# Patient Record
Sex: Female | Born: 1955 | Race: White | Hispanic: No | Marital: Married | Smoking: Never smoker
Health system: Southern US, Community
[De-identification: ages and names within clinical notes are randomized; demographics above are authoritative.]

## PROBLEM LIST (undated history)

## (undated) DIAGNOSIS — K222 Esophageal obstruction: Secondary | ICD-10-CM

## (undated) DIAGNOSIS — M26629 Arthralgia of temporomandibular joint, unspecified side: Secondary | ICD-10-CM

## (undated) DIAGNOSIS — E785 Hyperlipidemia, unspecified: Secondary | ICD-10-CM

## (undated) DIAGNOSIS — N62 Hypertrophy of breast: Secondary | ICD-10-CM

## (undated) DIAGNOSIS — E039 Hypothyroidism, unspecified: Secondary | ICD-10-CM

## (undated) DIAGNOSIS — R42 Dizziness and giddiness: Secondary | ICD-10-CM

## (undated) DIAGNOSIS — K219 Gastro-esophageal reflux disease without esophagitis: Secondary | ICD-10-CM

## (undated) DIAGNOSIS — K449 Diaphragmatic hernia without obstruction or gangrene: Secondary | ICD-10-CM

## (undated) DIAGNOSIS — N6310 Unspecified lump in the right breast, unspecified quadrant: Secondary | ICD-10-CM

## (undated) DIAGNOSIS — F419 Anxiety disorder, unspecified: Secondary | ICD-10-CM

## (undated) HISTORY — PX: CATARACT EXTRACTION, BILATERAL: SHX1313

## (undated) HISTORY — DX: Esophageal obstruction: K22.2

## (undated) HISTORY — PX: COLONOSCOPY: SHX5424

## (undated) HISTORY — PX: BREAST BIOPSY: SHX20

## (undated) HISTORY — DX: Hyperlipidemia, unspecified: E78.5

## (undated) HISTORY — DX: Diaphragmatic hernia without obstruction or gangrene: K44.9

---

## 2011-09-07 DIAGNOSIS — D229 Melanocytic nevi, unspecified: Secondary | ICD-10-CM | POA: Insufficient documentation

## 2011-09-07 DIAGNOSIS — L709 Acne, unspecified: Secondary | ICD-10-CM | POA: Insufficient documentation

## 2014-01-11 DIAGNOSIS — N62 Hypertrophy of breast: Secondary | ICD-10-CM

## 2014-01-11 HISTORY — DX: Hypertrophy of breast: N62

## 2014-01-12 ENCOUNTER — Encounter (HOSPITAL_BASED_OUTPATIENT_CLINIC_OR_DEPARTMENT_OTHER): Payer: Self-pay | Admitting: *Deleted

## 2014-01-16 ENCOUNTER — Ambulatory Visit (HOSPITAL_BASED_OUTPATIENT_CLINIC_OR_DEPARTMENT_OTHER)
Admission: RE | Admit: 2014-01-16 | Discharge: 2014-01-16 | Disposition: A | Discharge status: Home / Self Care | Payer: BC Managed Care – PPO | Source: Ambulatory Visit | Attending: Plastic Surgery | Admitting: Plastic Surgery

## 2014-01-16 ENCOUNTER — Encounter (HOSPITAL_BASED_OUTPATIENT_CLINIC_OR_DEPARTMENT_OTHER)
Admission: RE | Disposition: A | Discharge status: Home / Self Care | Payer: Self-pay | Source: Ambulatory Visit | Attending: Plastic Surgery

## 2014-01-16 ENCOUNTER — Ambulatory Visit (HOSPITAL_BASED_OUTPATIENT_CLINIC_OR_DEPARTMENT_OTHER): Payer: BC Managed Care – PPO | Admitting: Certified Registered"

## 2014-01-16 ENCOUNTER — Encounter (HOSPITAL_BASED_OUTPATIENT_CLINIC_OR_DEPARTMENT_OTHER): Payer: Self-pay | Admitting: *Deleted

## 2014-01-16 ENCOUNTER — Encounter (HOSPITAL_BASED_OUTPATIENT_CLINIC_OR_DEPARTMENT_OTHER): Payer: BC Managed Care – PPO | Admitting: Certified Registered"

## 2014-01-16 DIAGNOSIS — E039 Hypothyroidism, unspecified: Secondary | ICD-10-CM | POA: Insufficient documentation

## 2014-01-16 DIAGNOSIS — N62 Hypertrophy of breast: Secondary | ICD-10-CM | POA: Diagnosis present

## 2014-01-16 DIAGNOSIS — F419 Anxiety disorder, unspecified: Secondary | ICD-10-CM | POA: Diagnosis not present

## 2014-01-16 DIAGNOSIS — K219 Gastro-esophageal reflux disease without esophagitis: Secondary | ICD-10-CM | POA: Diagnosis not present

## 2014-01-16 HISTORY — DX: Hypothyroidism, unspecified: E03.9

## 2014-01-16 HISTORY — PX: BREAST REDUCTION SURGERY: SHX8

## 2014-01-16 HISTORY — DX: Anxiety disorder, unspecified: F41.9

## 2014-01-16 HISTORY — DX: Arthralgia of temporomandibular joint, unspecified side: M26.629

## 2014-01-16 HISTORY — DX: Gastro-esophageal reflux disease without esophagitis: K21.9

## 2014-01-16 HISTORY — DX: Hypertrophy of breast: N62

## 2014-01-16 LAB — POCT HEMOGLOBIN-HEMACUE: HEMOGLOBIN: 12.2 g/dL (ref 12.0–15.0)

## 2014-01-16 SURGERY — MAMMOPLASTY, REDUCTION
Anesthesia: General | Site: Breast | Laterality: Bilateral

## 2014-01-16 MED ORDER — BSS IO SOLN: 15.0000 mL | Freq: Once | INTRAOCULAR | Status: DC

## 2014-01-16 MED ORDER — LIDOCAINE HCL (CARDIAC) 20 MG/ML IV SOLN
INTRAVENOUS | Status: DC | PRN
  Administered 2014-01-16: 60 mg via INTRAVENOUS

## 2014-01-16 MED ORDER — BACITRACIN ZINC 500 UNIT/GM EX OINT
TOPICAL_OINTMENT | CUTANEOUS | Status: DC | PRN
  Administered 2014-01-16: 1 via TOPICAL

## 2014-01-16 MED ORDER — FENTANYL CITRATE 0.05 MG/ML IJ SOLN
INTRAMUSCULAR | Status: DC | PRN
  Administered 2014-01-16 (×2): 100 ug via INTRAVENOUS
  Administered 2014-01-16: 75 ug via INTRAVENOUS
  Administered 2014-01-16: 50 ug via INTRAVENOUS
  Administered 2014-01-16: 25 ug via INTRAVENOUS

## 2014-01-16 MED ORDER — OXYCODONE HCL 5 MG PO TABS
5.0000 mg | ORAL_TABLET | Freq: Once | ORAL | Status: AC | PRN
  Administered 2014-01-16: 5 mg via ORAL

## 2014-01-16 MED ORDER — GLYCOPYRROLATE 0.2 MG/ML IJ SOLN
INTRAMUSCULAR | Status: DC | PRN
  Administered 2014-01-16: 0.2 mg via INTRAVENOUS

## 2014-01-16 MED ORDER — MEPERIDINE HCL 25 MG/ML IJ SOLN: 6.2500 mg | INTRAMUSCULAR | Status: DC | PRN

## 2014-01-16 MED ORDER — MIDAZOLAM HCL 2 MG/2ML IJ SOLN: 1.0000 mg | INTRAMUSCULAR | Status: DC | PRN

## 2014-01-16 MED ORDER — OXYCODONE HCL 5 MG/5ML PO SOLN: 5.0000 mg | Freq: Once | ORAL | Status: AC | PRN

## 2014-01-16 MED ORDER — LACTATED RINGERS IV SOLN
INTRAVENOUS | Status: DC
  Administered 2014-01-16: 08:00:00 via INTRAVENOUS
  Administered 2014-01-16: 10 mL/h via INTRAVENOUS
  Administered 2014-01-16 (×2): via INTRAVENOUS

## 2014-01-16 MED ORDER — CEFAZOLIN SODIUM 1-5 GM-% IV SOLN
1.0000 g | Freq: Once | INTRAVENOUS | Status: AC
  Administered 2014-01-16: 2 g via INTRAVENOUS

## 2014-01-16 MED ORDER — ONDANSETRON HCL 4 MG/2ML IJ SOLN: 4.0000 mg | Freq: Once | INTRAMUSCULAR | Status: DC | PRN

## 2014-01-16 MED ORDER — PROPOFOL 10 MG/ML IV BOLUS
INTRAVENOUS | Status: AC
  Filled 2014-01-16: qty 20

## 2014-01-16 MED ORDER — CEFAZOLIN SODIUM-DEXTROSE 2-3 GM-% IV SOLR
INTRAVENOUS | Status: AC
  Filled 2014-01-16: qty 50

## 2014-01-16 MED ORDER — OXYCODONE HCL 5 MG PO TABS
ORAL_TABLET | ORAL | Status: AC
Start: 2014-01-16 — End: 2014-01-16
  Filled 2014-01-16: qty 1

## 2014-01-16 MED ORDER — BUPIVACAINE LIPOSOME 1.3 % IJ SUSP
INTRAMUSCULAR | Status: DC | PRN
  Administered 2014-01-16: 20 mL

## 2014-01-16 MED ORDER — HYDROMORPHONE HCL 1 MG/ML IJ SOLN
INTRAMUSCULAR | Status: AC
  Filled 2014-01-16: qty 1

## 2014-01-16 MED ORDER — PROPOFOL 10 MG/ML IV EMUL
INTRAVENOUS | Status: AC
  Filled 2014-01-16: qty 50

## 2014-01-16 MED ORDER — MIDAZOLAM HCL 2 MG/ML PO SYRP: 12.0000 mg | ORAL_SOLUTION | Freq: Once | ORAL | Status: DC | PRN

## 2014-01-16 MED ORDER — DEXAMETHASONE SODIUM PHOSPHATE 4 MG/ML IJ SOLN
INTRAMUSCULAR | Status: DC | PRN
  Administered 2014-01-16: 10 mg via INTRAVENOUS

## 2014-01-16 MED ORDER — LIDOCAINE-EPINEPHRINE 1 %-1:100000 IJ SOLN
INTRAMUSCULAR | Status: DC | PRN
  Administered 2014-01-16: 40 mL

## 2014-01-16 MED ORDER — KETOROLAC TROMETHAMINE 0.5 % OP SOLN
1.0000 [drp] | Freq: Three times a day (TID) | OPHTHALMIC | Status: DC | PRN
  Administered 2014-01-16: 1 [drp] via OPHTHALMIC
  Filled 2014-01-16: qty 3

## 2014-01-16 MED ORDER — HYDROMORPHONE HCL 1 MG/ML IJ SOLN
0.2500 mg | INTRAMUSCULAR | Status: DC | PRN
  Administered 2014-01-16 (×3): 0.5 mg via INTRAVENOUS

## 2014-01-16 MED ORDER — SUCCINYLCHOLINE CHLORIDE 20 MG/ML IJ SOLN
INTRAMUSCULAR | Status: DC | PRN
  Administered 2014-01-16: 100 mg via INTRAVENOUS

## 2014-01-16 MED ORDER — BUPIVACAINE LIPOSOME 1.3 % IJ SUSP
INTRAMUSCULAR | Status: AC
  Filled 2014-01-16: qty 20

## 2014-01-16 MED ORDER — FENTANYL CITRATE 0.05 MG/ML IJ SOLN: 50.0000 ug | INTRAMUSCULAR | Status: DC | PRN

## 2014-01-16 MED ORDER — SUCCINYLCHOLINE CHLORIDE 20 MG/ML IJ SOLN
INTRAMUSCULAR | Status: AC
  Filled 2014-01-16: qty 1

## 2014-01-16 MED ORDER — PROPOFOL 10 MG/ML IV BOLUS
INTRAVENOUS | Status: DC | PRN
  Administered 2014-01-16: 30 mg via INTRAVENOUS
  Administered 2014-01-16: 150 mg via INTRAVENOUS

## 2014-01-16 MED ORDER — LIDOCAINE-EPINEPHRINE 1 %-1:100000 IJ SOLN
INTRAMUSCULAR | Status: AC
  Filled 2014-01-16: qty 2

## 2014-01-16 MED ORDER — MIDAZOLAM HCL 5 MG/5ML IJ SOLN
INTRAMUSCULAR | Status: DC | PRN
  Administered 2014-01-16: 2 mg via INTRAVENOUS

## 2014-01-16 MED ORDER — MIDAZOLAM HCL 2 MG/2ML IJ SOLN
INTRAMUSCULAR | Status: AC
  Filled 2014-01-16: qty 2

## 2014-01-16 MED ORDER — ONDANSETRON HCL 4 MG/2ML IJ SOLN
INTRAMUSCULAR | Status: DC | PRN
  Administered 2014-01-16: 4 mg via INTRAVENOUS

## 2014-01-16 MED ORDER — FENTANYL CITRATE 0.05 MG/ML IJ SOLN
INTRAMUSCULAR | Status: AC
  Filled 2014-01-16: qty 12

## 2014-01-16 MED ORDER — BACITRACIN ZINC 500 UNIT/GM EX OINT
TOPICAL_OINTMENT | CUTANEOUS | Status: AC
  Filled 2014-01-16: qty 28.35

## 2014-01-16 MED ORDER — SCOPOLAMINE 1 MG/3DAYS TD PT72
MEDICATED_PATCH | TRANSDERMAL | Status: AC
  Filled 2014-01-16: qty 1

## 2014-01-16 SURGICAL SUPPLY — 62 items
BAG DECANTER FOR FLEXI CONT (MISCELLANEOUS) ×3 IMPLANT
BENZOIN TINCTURE PRP APPL 2/3 (GAUZE/BANDAGES/DRESSINGS) ×6 IMPLANT
BLADE KNIFE PERSONA 10 (BLADE) ×12 IMPLANT
BLADE KNIFE PERSONA 15 (BLADE) ×9 IMPLANT
BNDG GAUZE ELAST 4 BULKY (GAUZE/BANDAGES/DRESSINGS) ×6 IMPLANT
CANISTER SUCT 1200ML W/VALVE (MISCELLANEOUS) ×6 IMPLANT
CAP BOUFFANT 24 BLUE NURSES (PROTECTIVE WEAR) ×3 IMPLANT
CLOSURE WOUND 1/2 X4 (GAUZE/BANDAGES/DRESSINGS) ×4
COVER MAYO STAND STRL (DRAPES) ×3 IMPLANT
COVER TABLE BACK 60X90 (DRAPES) ×3 IMPLANT
DECANTER SPIKE VIAL GLASS SM (MISCELLANEOUS) IMPLANT
DRAIN CHANNEL 10F 3/8 F FF (DRAIN) ×6 IMPLANT
DRAPE LAPAROSCOPIC ABDOMINAL (DRAPES) ×3 IMPLANT
DRAPE U-SHAPE 76X120 STRL (DRAPES) IMPLANT
DRSG EMULSION OIL 3X3 NADH (GAUZE/BANDAGES/DRESSINGS) ×6 IMPLANT
DRSG PAD ABDOMINAL 8X10 ST (GAUZE/BANDAGES/DRESSINGS) ×6 IMPLANT
ELECT REM PT RETURN 9FT ADLT (ELECTROSURGICAL) ×3
ELECTRODE REM PT RTRN 9FT ADLT (ELECTROSURGICAL) ×1 IMPLANT
EVACUATOR SILICONE 100CC (DRAIN) ×6 IMPLANT
FILTER 7/8 IN (FILTER) IMPLANT
GAUZE SPONGE 4X4 12PLY STRL (GAUZE/BANDAGES/DRESSINGS) ×6 IMPLANT
GLOVE BIO SURGEON STRL SZ7 (GLOVE) ×3 IMPLANT
GLOVE BIOGEL M 7.0 STRL (GLOVE) ×3 IMPLANT
GLOVE BIOGEL PI IND STRL 7.0 (GLOVE) ×2 IMPLANT
GLOVE BIOGEL PI IND STRL 7.5 (GLOVE) ×1 IMPLANT
GLOVE BIOGEL PI INDICATOR 7.0 (GLOVE) ×4
GLOVE BIOGEL PI INDICATOR 7.5 (GLOVE) ×2
GLOVE ECLIPSE 6.5 STRL STRAW (GLOVE) ×12 IMPLANT
GOWN STRL REUS W/ TWL LRG LVL3 (GOWN DISPOSABLE) ×4 IMPLANT
GOWN STRL REUS W/TWL LRG LVL3 (GOWN DISPOSABLE) ×8
IV NS 250ML (IV SOLUTION) ×2
IV NS 250ML BAXH (IV SOLUTION) ×1 IMPLANT
NEEDLE HYPO 25X1 1.5 SAFETY (NEEDLE) ×9 IMPLANT
NEEDLE SPNL 18GX3.5 QUINCKE PK (NEEDLE) ×3 IMPLANT
NS IRRIG 1000ML POUR BTL (IV SOLUTION) ×9 IMPLANT
PACK BASIN DAY SURGERY FS (CUSTOM PROCEDURE TRAY) ×3 IMPLANT
PIN SAFETY STERILE (MISCELLANEOUS) ×3 IMPLANT
SCRUB PCMX 4 OZ (MISCELLANEOUS) ×3 IMPLANT
SLEEVE SCD COMPRESS KNEE MED (MISCELLANEOUS) ×3 IMPLANT
SPECIMEN JAR MEDIUM (MISCELLANEOUS) IMPLANT
SPECIMEN JAR X LARGE (MISCELLANEOUS) ×6 IMPLANT
SPONGE LAP 18X18 X RAY DECT (DISPOSABLE) ×9 IMPLANT
STAPLER VISISTAT 35W (STAPLE) ×9 IMPLANT
STRIP CLOSURE SKIN 1/2X4 (GAUZE/BANDAGES/DRESSINGS) ×8 IMPLANT
SUT ETHILON 3 0 PS 1 (SUTURE) ×3 IMPLANT
SUT MNCRL AB 3-0 PS2 18 (SUTURE) ×12 IMPLANT
SUT MNCRL AB 4-0 PS2 18 (SUTURE) ×6 IMPLANT
SUT MON AB 5-0 PS2 18 (SUTURE) ×6 IMPLANT
SUT PROLENE 2 0 CT2 30 (SUTURE) ×3 IMPLANT
SUT PROLENE 3 0 PS 1 (SUTURE) ×6 IMPLANT
SUT QUILL PDO 2-0 (SUTURE) ×6 IMPLANT
SYR BULB IRRIGATION 50ML (SYRINGE) ×6 IMPLANT
SYR CONTROL 10ML LL (SYRINGE) ×9 IMPLANT
TOWEL OR 17X24 6PK STRL BLUE (TOWEL DISPOSABLE) ×9 IMPLANT
TOWEL OR NON WOVEN STRL DISP B (DISPOSABLE) ×3 IMPLANT
TRAY DSU PREP LF (CUSTOM PROCEDURE TRAY) ×3 IMPLANT
TRAY FOLEY CATH 16FR SILVER (SET/KITS/TRAYS/PACK) ×3 IMPLANT
TUBE CONNECTING 20'X1/4 (TUBING) ×1
TUBE CONNECTING 20X1/4 (TUBING) ×2 IMPLANT
UNDERPAD 30X30 INCONTINENT (UNDERPADS AND DIAPERS) ×9 IMPLANT
VAC PENCILS W/TUBING CLEAR (MISCELLANEOUS) ×3 IMPLANT
YANKAUER SUCT BULB TIP NO VENT (SUCTIONS) ×3 IMPLANT

## 2014-01-16 NOTE — Brief Op Note (Signed)
01/16/2014  11:49 AM  PATIENT:  Jessica [name]  58 y.o. female  PRE-OPERATIVE DIAGNOSIS:  Bilateral Hypertrophy of breasts [N62]  POST-OPERATIVE DIAGNOSIS:  Bilateral Hypertrophy of breasts  PROCEDURE:  Procedure(s): BILATERAL MAMMARY REDUCTION  (BREAST) (Bilateral)  SURGEON:  Surgeon(s) and Role:    * Cristianna Cyr A Demon Volante, MD - Primary  ANESTHESIA:   general  EBL:  Total I/O In: 2500 [I.V.:2500] Out: 875 [Urine:700; Blood:175]  BLOOD ADMINISTERED:none  DRAINS: (65F) Jackson-Pratt drain(s) with closed bulb suction in the Bilateral breasts   LOCAL MEDICATIONS USED:  1.3% Exparel (total 266 mgs.)  SPECIMEN:  Source of Specimen:  Bilareal breasts  DISPOSITION OF SPECIMEN:  PATHOLOGY  COUNTS:  YES  DICTATION: .Other Dictation: Dictation Number [id number]  PLAN OF CARE: Discharge to home after PACU  PATIENT DISPOSITION:  PACU - hemodynamically stable.   Delay start of Pharmacological VTE agent (>24hrs) due to surgical blood loss or risk of bleeding: not applicable

## 2014-01-16 NOTE — Anesthesia Procedure Notes (Signed)
Procedure Name: Intubation Date/Time: 01/16/2014 7:50 AM Performed by: Dashauna Heymann Pre-anesthesia Checklist: Patient identified, Emergency Drugs available, Suction available and Patient being monitored Patient Re-evaluated:Patient Re-evaluated prior to inductionOxygen Delivery Method: Circle System Utilized Preoxygenation: Pre-oxygenation with 100% oxygen Intubation Type: IV induction Ventilation: Mask ventilation without difficulty Laryngoscope Size: Mac and 3 Grade View: Grade I Tube type: Oral Tube size: 7.0 mm Number of attempts: 1 Airway Equipment and Method: stylet and oral airway Placement Confirmation: ETT inserted through vocal cords under direct vision,  positive ETCO2 and breath sounds checked- equal and bilateral Tube secured with: Tape Dental Injury: Teeth and Oropharynx as per pre-operative assessment

## 2014-01-16 NOTE — H&P (Signed)
  H&P faxed to surgical center.  -History and Physical Reviewed  -Patient has been re-examined  -No change in the plan of care  Zonie Crutcher A    

## 2014-01-16 NOTE — Anesthesia Preprocedure Evaluation (Addendum)
Anesthesia Evaluation  Patient identified by MRN, date of birth, ID band Patient awake    Reviewed: Allergy & Precautions, H&P , NPO status , Patient's Chart, lab work & pertinent test results  Airway Mallampati: I TM Distance: >3 FB Neck ROM: Full    Dental   Pulmonary          Cardiovascular     Neuro/Psych PSYCHIATRIC DISORDERS Anxiety    GI/Hepatic GERD-  Medicated and Controlled,  Endo/Other  Hypothyroidism   Renal/GU      Musculoskeletal   Abdominal   Peds  Hematology   Anesthesia Other Findings   Reproductive/Obstetrics                          Anesthesia Physical Anesthesia Plan  ASA: I  Anesthesia Plan: General   Post-op Pain Management:    Induction: Intravenous  Airway Management Planned: Oral ETT  Additional Equipment:   Intra-op Plan:   Post-operative Plan: Extubation in OR  Informed Consent: I have reviewed the patients History and Physical, chart, labs and discussed the procedure including the risks, benefits and alternatives for the proposed anesthesia with the patient or authorized representative who has indicated his/her understanding and acceptance.     Plan Discussed with: CRNA and Surgeon  Anesthesia Plan Comments:         Anesthesia Quick Evaluation

## 2014-01-16 NOTE — Discharge Instructions (Signed)
JP Drain [employer] this sheet to all of your post-operative appointments while you have your drains.  Please measure your drains by CC's or ML's.  Make sure you drain and measure your JP Drains 3 times per day.  At the end of each day, add up totals for the left side and add up totals for the right side.    ( 9 am )     ( 3 pm )        ( 9 pm )                Date L  R  L  R  L  R  Total L/R                                                                                                                                                                                          Post Anesthesia Home Care Instructions  Activity: Get plenty of rest for the remainder of the day. A responsible adult should stay with you for 24 hours following the procedure.  For the next 24 hours, DO NOT: -Drive a car -Paediatric nurse -Drink alcoholic beverages -Take any medication unless instructed by your physician -Make any legal decisions or sign important papers.  Meals: Start with liquid foods such as gelatin or soup. Progress to regular foods as tolerated. Avoid greasy, spicy, heavy foods. If nausea and/or vomiting occur, drink only clear liquids until the nausea and/or vomiting subsides. Call your physician if vomiting continues.  Special Instructions/Symptoms: Your throat may feel dry or sore from the anesthesia or the breathing tube placed in your throat during surgery. If this causes discomfort, gargle with warm salt water. The discomfort should disappear within 24 hours.   Information for Discharge Teaching: EXPAREL (bupivacaine liposome injectable suspension)   Your surgeon gave you EXPAREL(bupivacaine) in your surgical incision to help control your pain after surgery.   EXPAREL is a local anesthetic that provides pain relief by numbing the tissue around the surgical site.  EXPAREL is designed to release pain medication over time and can control pain for up to 72  hours.  Depending on how you respond to EXPAREL, you may require less pain medication during your recovery.  Possible side effects:  Temporary loss of sensation or ability to move in the area where bupivacaine was injected.  Nausea, vomiting, constipation  Rarely, numbness and tingling in your mouth or lips, lightheadedness, or anxiety may occur.  Call your doctor right away if you think you may be experiencing any of these sensations, or if you have other questions regarding possible side effects.  Follow all other discharge instructions given to you by your surgeon or nurse. Eat a healthy diet and drink plenty of water or other fluids.  If you return to the hospital for any reason within 96 hours following the administration of EXPAREL, please inform your health care providers.

## 2014-01-16 NOTE — Progress Notes (Signed)
Called to evaluate pt with C/O "scatchy" feeling in Left eye.  Pt reports pain in left eye, some blurring of vision.  Exam revealed PERRLA, sclera injected on R side.  No obvious abrasion noted.  Eye irrigated with BSS with some improvement.  Acular drops given with incomplete relief.  Pt asked for eye to be patched, and this was done. A/P: Probable corneal abrasion.  Pt instructed to use Acular drops.  If not better in 24 hours will need to seek opthalmologic evaluation.  This was discussed with patient and family agreed with the plan. We will contact her tomorrow to assess her progress.  They understand that corneal abrasions can take 24-48 to heal.

## 2014-01-16 NOTE — Anesthesia Postprocedure Evaluation (Signed)
Anesthesia Post Note  Patient: Jessica [name]  Procedure(s) Performed: Procedure(s) (LRB): BILATERAL MAMMARY REDUCTION  (BREAST) (Bilateral)  Anesthesia type: general  Patient location: PACU  Post pain: Pain level controlled  Post assessment: Patient's Cardiovascular Status Stable  Last Vitals:  Filed Vitals:   01/16/14 1330  BP: 105/55  Pulse: 70  Temp:   Resp: 18    Post vital signs: Reviewed and stable  Level of consciousness: sedated  Complications: probable corneal abrasion

## 2014-01-16 NOTE — Transfer of Care (Signed)
Immediate Anesthesia Transfer of Care Note  Patient: Jessica [name]  Procedure(s) Performed: Procedure(s): BILATERAL MAMMARY REDUCTION  (BREAST) (Bilateral)  Patient Location: PACU  Anesthesia Type:General  Level of Consciousness: awake, alert , oriented and patient cooperative  Airway & Oxygen Therapy: Patient Spontanous Breathing and Patient connected to face mask oxygen  Post-op Assessment: Report given to PACU RN and Post -op Vital signs reviewed and stable  Post vital signs: Reviewed and stable  Complications: No apparent anesthesia complications

## 2014-01-17 ENCOUNTER — Encounter (HOSPITAL_BASED_OUTPATIENT_CLINIC_OR_DEPARTMENT_OTHER): Payer: Self-pay | Admitting: Plastic Surgery

## 2014-02-23 NOTE — Op Note (Signed)
NAMESAFAA, STINGLEY NO.:  [id number]  MEDICAL RECORD NO.:  [id number]  LOCATION:                                 FACILITY:  PHYSICIAN:  Hetty Blend, M.D.DATE OF BIRTH:  30-May-1966  DATE OF PROCEDURE:  01/16/2014 DATE OF DISCHARGE:                              OPERATIVE REPORT   PREOPERATIVE DIAGNOSIS:  Bilateral macromastia.  POSTOPERATIVE DIAGNOSIS:  Bilateral macromastia.  PROCEDURE:  Bilateral reduction mammoplasties.  ATTENDING SURGEON:  Hetty Blend, MD  ANESTHESIA:  General.  ANESTHESIOLOGIST:  Crissie Sickles. Conrad Lincoln, MD  COMPLICATIONS:  None.  INDICATIONS FOR THE PROCEDURE:  The patient is a 58 year old Caucasian female who has bilateral macromastia that is clinically symptomatic. She presents to undergo bilateral reduction mammoplasties.  DESCRIPTION OF PROCEDURE:  The patient was brought into the preop holding area and marked in a pattern of Wise for the future bilateral reduction mammoplasties.  She was then taken back to the OR, placed on the table in supine position.  After adequate general anesthesia was obtained, the patient's chest was prepped with Techni-Care and draped in sterile fashion.  The bases of the breasts had been injected with 1% lidocaine with epinephrine.  After adequate hemostasis and anesthesia had taken effect, the procedure was begun.  Both of the breast reductions were performed in the following similar manner.  The nipple-areolar complex was marked with a 45-mm nipple marker.  The skin was then incised and deepithelialized around the nipple-areolar complex down to the inframammary crease in inferior pedicle pattern.  Next, the medial, superior, and lateral skin flaps were elevated down to the chest wall.  The excess fat and glandular tissue removed from the inferior pedicle.  The nipple-areolar complex was examined and found to be pink and viable.  The wound was irrigated with saline irrigation.  Meticulous  hemostasis was obtained with the Bovie electrocautery.  The inferior pedicle was centralized using 3-0 Prolene suture.  A #10 JP flat fully fluted drain was placed into the wound.  The skin flaps were brought together at the inverted T junction with a 2-0 Prolene suture.  The incisions were stapled for temporary closure.  The breasts compared and found to have good shape and symmetry.  The incisions were then closed from the medial aspect of the [facility] incision to the medial aspect of the JP drain first placing a few 3- 0 Monocryl sutures to tack together the dermal layer, and then both the dermal and cuticular layer were closed in a single layer using a 2-0 Quill PDO barbed suture.  Lateral to the JP drain incision was closed using 3-0 Monocryl in the dermal layer, followed by 3-0 Monocryl running intracuticular stitch on the skin.  The vertical limb of the Wise pattern was closed in the dermal layer using 3-0 Monocryl suture.  The patient was placed in the upright position.  The future location in the nipple-areolar complex was marked on both breast mounds using the 45- mm nipple marker.  She was then placed back in the recumbent position.  Both of the nipple-areolar complexes were brought out onto the breast mounds in the following similar manner.  The skin was incised and marked and removed full thickness  into the subcutaneous tissues.  The nipple- areolar complex was examined, found to be pink and viable, then brought out through this aperture and sewn in place using 4-0 Monocryl in the dermal layer, followed by a 5-0 Monocryl running intracuticular stitch on the skin.  The 5-0 Monocryl suture was then brought down the cuticular layer of the vertical limb as well.  The JP drain was sewn in place using 3-0 nylon suture.  Prior to closing the incision, the breast and chest soft tissues along with pectoralis major muscle and fascia were infiltrated with 1.3% Exparel and now the [facility]  incision was infiltrated as well for a total of 266 mg to provide postoperative pain control with the patient.  The incision was dressed with benzoin, Steri- Strips, and the nipples were additionally dressed with bacitracin ointment and Adaptic.  4x4s placed over the incisions and ABD pads in the axillary areas.  The patient was placed into light postoperative support bra.  There were no complications.  The patient tolerated the procedure well.  The final needle, sponge counts were reported to be correct at the end of the case.  The patient was also extubated and taken to the PACU in stable condition.  She was recovered in the PACU without complications.  Both the patient and her family were given proper postoperative wound care instructions including care of the JP drains.  The patient was then discharged home in the care of her family in stable condition.  Followup appointment will be within a few days in the office.          ______________________________ Hetty Blend, M.D.     MC/MEDQ  D:  02/22/2014  T:  02/23/2014  Job:  [id number]

## 2014-03-22 ENCOUNTER — Other Ambulatory Visit: Payer: Self-pay | Admitting: Podiatry

## 2014-03-30 NOTE — Patient Instructions (Signed)
Jessica [name]  03/30/2014   Your procedure is scheduled on:  04/11/2014  Report to [facility] at  33  AM.  Call this number if you have problems the morning of surgery: [phone]   Remember:   Do not eat food or drink liquids after midnight.   Take these medicines the morning of surgery with A SIP OF WATER:  Xanax, abilify, nexium, levothyroxine, zolft   Do not wear jewelry, make-up or nail polish.  Do not wear lotions, powders, or perfumes.   Do not shave 48 hours prior to surgery. Men may shave face and neck.  Do not bring valuables to the hospital.  [facility] is not responsible for any belongings or valuables.               Contacts, dentures or bridgework may not be worn into surgery.  Leave suitcase in the car. After surgery it may be brought to your room.  For patients admitted to the hospital, discharge time is determined by your treatment team.               Patients discharged the day of surgery will not be allowed to drive home.  Name and phone number of your driver: family  Special Instructions: Shower using CHG 2 nights before surgery and the night before surgery.  If you shower the day of surgery use CHG.  Use special wash - you have one bottle of CHG for all showers.  You should use approximately 1/3 of the bottle for each shower.   Please read over the following fact sheets that you were given: Pain Booklet, Coughing and Deep Breathing, Surgical Site Infection Prevention, Anesthesia Post-op Instructions and Care and Recovery After Surgery Bunionectomy A bunionectomy is surgery to remove a bunion. A bunion is an enlargement of the joint at the base of the big toe. It is made up of bone and soft tissue on the inside part of the joint. Over time, a painful lump appears on the inside of the joint. The big toe begins to point inward toward the second toe. New bone growth can occur and a bone spur may form. The pain eventually causes difficulty walking. A bunion  usually results from inflammation caused by the irritation of poorly fitting shoes. It often begins later in life. A bunionectomy is performed when nonsurgical treatment no longer works. When surgery is needed, the extent of the procedure will depend on the degree of deformity of the foot. Your surgeon will discuss with you the different procedures and what will work best for you depending on your age and health. LET YOUR CAREGIVER KNOW ABOUT:   Previous problems with anesthetics or medicines used to numb the skin.  Allergies to dyes, iodine, foods, and/or latex.  Medicines taken including herbs, eye drops, prescription medicines (especially medicines used to "thin the blood"), aspirin and other over-the-counter medicines, and steroids (by mouth or as a cream).  History of bleeding or blood problems.  Possibility of pregnancy, if this applies.  History of blood clots in your legs and/or lungs .  Previous surgery.  Other important health problems. RISKS AND COMPLICATIONS   Infection.  Pain.  Nerve damage.  Possibility that the bunion will recur. BEFORE THE PROCEDURE  You should be present 60 minutes prior to your procedure or as directed.  PROCEDURE  Surgery is often done so that you can go home the same day (outpatient). It may be done in a hospital or  in an outpatient surgical center. An anesthetic will be used to help you sleep during the procedure. Sometimes, a spinal anesthetic is used to make you numb below the waist. A cut (incision) is made over the swollen area at the first joint of the big toe. The enlarged lump will be removed. If there is a need to reposition the bones of the big toe, this may require more than 1 incision. The bone itself may need to be cut. Screws and wires may be used in the repair. These can be removed at a later date. In severe cases, the entire joint may need to be removed and a joint replacement inserted. When done, the incision is closed with stitches  (sutures). Skin adhesive strips may be added for reinforcement. They help hold the incision closed.  AFTER THE PROCEDURE  Compression bandages (dressings) are then wrapped around the wound. This helps to keep the foot in alignment and reduce swelling. Your foot will be monitored for bleeding and swelling. You will need to stay for a few hours in the recovery area before being discharged. This allows time for the anesthesia to wear off. You will be discharged home when you are awake, stable, and doing well. HOME CARE INSTRUCTIONS   You can expect to return to normal activities within 6 to 8 weeks after surgery. The foot is at increased risk for swelling for several months. When you can expect to bear weight on the operated foot will depend on the extent of your surgery. The milder the deformity, the less tissue is removed and the sooner the return to normal activity level. During the recovery period, a special shoe, boot, or cast may be worn to accommodate the surgical bandage and to help provide stability to the foot.  Once you are home, an ice pack applied to the operative site may help with discomfort and keep swelling down. Stop using the ice if it causes discomfort.  Keep your feet raised (elevated) when possible to lessen swelling.  If you have an elastic bandage on your foot and you have numbness, tingling, or your foot becomes cold and blue, adjust the bandage to make it comfortable.  Change dressings as directed.  Keep the wound dry and clean. The wound may be washed gently with soap and water. Gently blot dry without rubbing. Do not take baths or use swimming pools or hot tubs for 10 days, or as instructed by your caregiver.  Only take over-the-counter or prescription medicines for pain, discomfort, or fever as directed by your caregiver.  You may continue a normal diet as directed.  For activity, use crutches with no weight bearing or your orthopedic shoe as directed. Continue to use  crutches or a cane as directed until you can stand without causing pain. SEEK MEDICAL CARE IF:   You have redness, swelling, bruising, or increasing pain in the wound.  There is pus coming from the wound.  You have drainage from a wound lasting longer than 1 day.  You have an oral temperature above 102 F (38.9 C).  You notice a bad smell coming from the wound or dressing.  The wound breaks open after sutures have been removed.  You develop dizzy episodes or fainting while standing.  You have persistent nausea or vomiting.  Your toes become cold.  Pain is not relieved with medicines. SEEK IMMEDIATE MEDICAL CARE IF:   You develop a rash.  You have difficulty breathing.  You develop any reaction or side effects  to medicines given.  Your toes are numb or blue, or you have severe pain. MAKE SURE YOU:   Understand these instructions.  Will watch your condition.  Will get help right away if you are not doing well or get worse. Document Released: 03/13/2005 Document Revised: 06/22/2011 Document Reviewed: 04/18/2007 [facility] Patient Information 2015 [place], [place]. This information is not intended to replace advice given to you by your health care provider. Make sure you discuss any questions you have with your health care provider. PATIENT INSTRUCTIONS POST-ANESTHESIA  IMMEDIATELY FOLLOWING SURGERY:  Do not drive or operate machinery for the first twenty four hours after surgery.  Do not make any important decisions for twenty four hours after surgery or while taking narcotic pain medications or sedatives.  If you develop intractable nausea and vomiting or a severe headache please notify your doctor immediately.  FOLLOW-UP:  Please make an appointment with your surgeon as instructed. You do not need to follow up with anesthesia unless specifically instructed to do so.  WOUND CARE INSTRUCTIONS (if applicable):  Keep a dry clean dressing on the anesthesia/puncture wound site  if there is drainage.  Once the wound has quit draining you may leave it open to air.  Generally you should leave the bandage intact for twenty four hours unless there is drainage.  If the epidural site drains for more than 36-48 hours please call the anesthesia department.  QUESTIONS?:  Please feel free to call your physician or the hospital operator if you have any questions, and they will be happy to assist you.

## 2014-04-02 ENCOUNTER — Ambulatory Visit (HOSPITAL_COMMUNITY)
Admission: RE | Admit: 2014-04-02 | Discharge: 2014-04-02 | Disposition: A | Discharge status: Home / Self Care | Payer: BC Managed Care – PPO | Source: Ambulatory Visit | Attending: Podiatry | Admitting: Podiatry

## 2014-04-02 ENCOUNTER — Encounter (HOSPITAL_COMMUNITY): Payer: Self-pay

## 2014-04-02 ENCOUNTER — Encounter (HOSPITAL_COMMUNITY)
Admission: RE | Admit: 2014-04-02 | Discharge: 2014-04-02 | Disposition: A | Discharge status: Home / Self Care | Payer: BC Managed Care – PPO | Source: Ambulatory Visit | Attending: Podiatry | Admitting: Podiatry

## 2014-04-02 DIAGNOSIS — Z01818 Encounter for other preprocedural examination: Secondary | ICD-10-CM

## 2014-04-02 DIAGNOSIS — M2011 Hallux valgus (acquired), right foot: Secondary | ICD-10-CM | POA: Insufficient documentation

## 2014-04-02 LAB — HEMOGLOBIN AND HEMATOCRIT, BLOOD
HEMATOCRIT: 39.3 % (ref 36.0–46.0)
Hemoglobin: 12.6 g/dL (ref 12.0–15.0)

## 2014-04-02 LAB — BASIC METABOLIC PANEL
Anion gap: 12 (ref 5–15)
BUN: 17 mg/dL (ref 6–23)
CALCIUM: 9.5 mg/dL (ref 8.4–10.5)
CO2: 25 mEq/L (ref 19–32)
Chloride: 105 mEq/L (ref 96–112)
Creatinine, Ser: 0.98 mg/dL (ref 0.50–1.10)
GFR calc Af Amer: 73 mL/min — ABNORMAL LOW (ref >90)
GFR, EST NON AFRICAN AMERICAN: 63 mL/min — AB (ref >90)
GLUCOSE: 101 mg/dL — AB (ref 70–99)
POTASSIUM: 4.5 meq/L (ref 3.7–5.3)
Sodium: 142 mEq/L (ref 137–147)

## 2014-04-11 ENCOUNTER — Ambulatory Visit (HOSPITAL_COMMUNITY): Payer: BC Managed Care – PPO

## 2014-04-11 ENCOUNTER — Ambulatory Visit (HOSPITAL_COMMUNITY): Payer: BC Managed Care – PPO | Admitting: Anesthesiology

## 2014-04-11 ENCOUNTER — Ambulatory Visit (HOSPITAL_COMMUNITY)
Admission: RE | Admit: 2014-04-11 | Discharge: 2014-04-11 | Disposition: A | Discharge status: Home / Self Care | Payer: BC Managed Care – PPO | Source: Ambulatory Visit | Attending: Podiatry | Admitting: Podiatry

## 2014-04-11 ENCOUNTER — Encounter (HOSPITAL_COMMUNITY): Payer: Self-pay | Admitting: *Deleted

## 2014-04-11 ENCOUNTER — Encounter (HOSPITAL_COMMUNITY)
Admission: RE | Disposition: A | Discharge status: Home / Self Care | Payer: Self-pay | Source: Ambulatory Visit | Attending: Podiatry

## 2014-04-11 DIAGNOSIS — M2010 Hallux valgus (acquired), unspecified foot: Secondary | ICD-10-CM | POA: Diagnosis present

## 2014-04-11 DIAGNOSIS — N62 Hypertrophy of breast: Secondary | ICD-10-CM | POA: Insufficient documentation

## 2014-04-11 DIAGNOSIS — M2041 Other hammer toe(s) (acquired), right foot: Secondary | ICD-10-CM | POA: Diagnosis not present

## 2014-04-11 DIAGNOSIS — K219 Gastro-esophageal reflux disease without esophagitis: Secondary | ICD-10-CM | POA: Insufficient documentation

## 2014-04-11 DIAGNOSIS — Z9889 Other specified postprocedural states: Secondary | ICD-10-CM

## 2014-04-11 DIAGNOSIS — M21961 Unspecified acquired deformity of right lower leg: Secondary | ICD-10-CM | POA: Diagnosis not present

## 2014-04-11 DIAGNOSIS — M79671 Pain in right foot: Secondary | ICD-10-CM | POA: Diagnosis not present

## 2014-04-11 DIAGNOSIS — F419 Anxiety disorder, unspecified: Secondary | ICD-10-CM | POA: Insufficient documentation

## 2014-04-11 DIAGNOSIS — E039 Hypothyroidism, unspecified: Secondary | ICD-10-CM | POA: Insufficient documentation

## 2014-04-11 DIAGNOSIS — M7741 Metatarsalgia, right foot: Secondary | ICD-10-CM | POA: Insufficient documentation

## 2014-04-11 DIAGNOSIS — S92912A Unspecified fracture of left toe(s), initial encounter for closed fracture: Secondary | ICD-10-CM

## 2014-04-11 DIAGNOSIS — M2011 Hallux valgus (acquired), right foot: Secondary | ICD-10-CM

## 2014-04-11 HISTORY — PX: WEIL OSTEOTOMY: SHX5044

## 2014-04-11 HISTORY — PX: AIKEN OSTEOTOMY: SHX6331

## 2014-04-11 HISTORY — PX: BUNIONECTOMY: SHX129

## 2014-04-11 HISTORY — PX: FLEXOR TENOTOMY: SHX6342

## 2014-04-11 SURGERY — BUNIONECTOMY
Anesthesia: Monitor Anesthesia Care | Site: Foot | Laterality: Right

## 2014-04-11 MED ORDER — EPHEDRINE SULFATE 50 MG/ML IJ SOLN
INTRAMUSCULAR | Status: AC
  Filled 2014-04-11: qty 1

## 2014-04-11 MED ORDER — CEFAZOLIN SODIUM-DEXTROSE 2-3 GM-% IV SOLR: 2.0000 g | Freq: Once | INTRAVENOUS | Status: DC

## 2014-04-11 MED ORDER — LACTATED RINGERS IV SOLN
INTRAVENOUS | Status: DC | PRN
  Administered 2014-04-11 (×2): via INTRAVENOUS

## 2014-04-11 MED ORDER — SODIUM CHLORIDE 0.9 % IJ SOLN
INTRAMUSCULAR | Status: AC
  Filled 2014-04-11: qty 10

## 2014-04-11 MED ORDER — PROPOFOL 10 MG/ML IV EMUL
INTRAVENOUS | Status: AC
  Filled 2014-04-11: qty 20

## 2014-04-11 MED ORDER — FENTANYL CITRATE 0.05 MG/ML IJ SOLN
INTRAMUSCULAR | Status: AC
  Filled 2014-04-11: qty 2

## 2014-04-11 MED ORDER — ONDANSETRON HCL 4 MG/2ML IJ SOLN
4.0000 mg | Freq: Once | INTRAMUSCULAR | Status: AC
  Administered 2014-04-11: 4 mg via INTRAVENOUS

## 2014-04-11 MED ORDER — LIDOCAINE HCL (PF) 1 % IJ SOLN
INTRAMUSCULAR | Status: DC | PRN
  Administered 2014-04-11: 3 mL

## 2014-04-11 MED ORDER — FENTANYL CITRATE 0.05 MG/ML IJ SOLN
INTRAMUSCULAR | Status: DC | PRN
  Administered 2014-04-11 (×4): 25 ug via INTRAVENOUS

## 2014-04-11 MED ORDER — ONDANSETRON HCL 4 MG/2ML IJ SOLN
INTRAMUSCULAR | Status: AC
  Filled 2014-04-11: qty 2

## 2014-04-11 MED ORDER — SODIUM CHLORIDE 0.9 % IR SOLN
Status: DC | PRN
  Administered 2014-04-11: 1000 mL

## 2014-04-11 MED ORDER — LIDOCAINE HCL (PF) 1 % IJ SOLN
INTRAMUSCULAR | Status: AC
  Filled 2014-04-11: qty 30

## 2014-04-11 MED ORDER — BUPIVACAINE HCL (PF) 0.5 % IJ SOLN
INTRAMUSCULAR | Status: DC | PRN
  Administered 2014-04-11: 20 mL
  Administered 2014-04-11: 10 mL

## 2014-04-11 MED ORDER — PROPOFOL INFUSION 10 MG/ML OPTIME
INTRAVENOUS | Status: DC | PRN
  Administered 2014-04-11: 75 ug/kg/min via INTRAVENOUS
  Administered 2014-04-11: 08:00:00 via INTRAVENOUS

## 2014-04-11 MED ORDER — MIDAZOLAM HCL 5 MG/5ML IJ SOLN
INTRAMUSCULAR | Status: DC | PRN
  Administered 2014-04-11 (×4): 0.5 mg via INTRAVENOUS

## 2014-04-11 MED ORDER — MIDAZOLAM HCL 2 MG/2ML IJ SOLN
1.0000 mg | INTRAMUSCULAR | Status: DC | PRN
  Administered 2014-04-11: 2 mg via INTRAVENOUS

## 2014-04-11 MED ORDER — FENTANYL CITRATE 0.05 MG/ML IJ SOLN
25.0000 ug | INTRAMUSCULAR | Status: AC
  Administered 2014-04-11: 25 ug via INTRAVENOUS

## 2014-04-11 MED ORDER — MIDAZOLAM HCL 2 MG/2ML IJ SOLN
INTRAMUSCULAR | Status: AC
  Filled 2014-04-11: qty 2

## 2014-04-11 MED ORDER — LACTATED RINGERS IV SOLN
INTRAVENOUS | Status: DC
  Administered 2014-04-11: 08:00:00 via INTRAVENOUS

## 2014-04-11 MED ORDER — CEFAZOLIN SODIUM-DEXTROSE 2-3 GM-% IV SOLR
INTRAVENOUS | Status: AC
Start: 2014-04-11 — End: 2014-04-11
  Filled 2014-04-11: qty 50

## 2014-04-11 MED ORDER — LIDOCAINE HCL (PF) 1 % IJ SOLN
INTRAMUSCULAR | Status: AC
  Filled 2014-04-11: qty 5

## 2014-04-11 MED ORDER — SUCCINYLCHOLINE CHLORIDE 20 MG/ML IJ SOLN
INTRAMUSCULAR | Status: AC
  Filled 2014-04-11: qty 1

## 2014-04-11 MED ORDER — FENTANYL CITRATE 0.05 MG/ML IJ SOLN: 25.0000 ug | INTRAMUSCULAR | Status: DC | PRN

## 2014-04-11 MED ORDER — BUPIVACAINE HCL (PF) 0.5 % IJ SOLN
INTRAMUSCULAR | Status: AC
  Filled 2014-04-11: qty 30

## 2014-04-11 MED ORDER — ONDANSETRON HCL 4 MG/2ML IJ SOLN: 4.0000 mg | Freq: Once | INTRAMUSCULAR | Status: DC | PRN

## 2014-04-11 MED ORDER — ARTIFICIAL TEARS OP OINT
TOPICAL_OINTMENT | OPHTHALMIC | Status: AC
  Filled 2014-04-11: qty 3.5

## 2014-04-11 MED ORDER — LIDOCAINE HCL (CARDIAC) 10 MG/ML IV SOLN
INTRAVENOUS | Status: DC | PRN
  Administered 2014-04-11: 25 mg via INTRAVENOUS

## 2014-04-11 MED ORDER — CEFAZOLIN SODIUM-DEXTROSE 2-3 GM-% IV SOLR
2.0000 g | Freq: Once | INTRAVENOUS | Status: AC
  Administered 2014-04-11: 2 g via INTRAVENOUS

## 2014-04-11 SURGICAL SUPPLY — 61 items
BAG HAMPER (MISCELLANEOUS) ×3 IMPLANT
BANDAGE ELASTIC 4 VELCRO NS (GAUZE/BANDAGES/DRESSINGS) ×3 IMPLANT
BANDAGE ELASTIC 4 VELCRO ST LF (GAUZE/BANDAGES/DRESSINGS) ×3 IMPLANT
BANDAGE ESMARK 4X12 BL STRL LF (DISPOSABLE) ×1 IMPLANT
BANDAGE GAUZE ELAST BULKY 4 IN (GAUZE/BANDAGES/DRESSINGS) ×6 IMPLANT
BENZOIN TINCTURE PRP APPL 2/3 (GAUZE/BANDAGES/DRESSINGS) ×3 IMPLANT
BIT DRILL 2.0 HCS 150 (BIT) IMPLANT
BIT DRILL MICR ACTRK 2 LNG PRF (BIT) ×1 IMPLANT
BLADE 15 SAFETY STRL DISP (BLADE) ×6 IMPLANT
BLADE AVERAGE 25MMX9MM (BLADE) ×1
BLADE AVERAGE 25X9 (BLADE) ×2 IMPLANT
BNDG CONFORM 2 STRL LF (GAUZE/BANDAGES/DRESSINGS) ×3 IMPLANT
BNDG ESMARK 4X12 BLUE STRL LF (DISPOSABLE) ×3
BNDG GAUZE ELAST 4 BULKY (GAUZE/BANDAGES/DRESSINGS) ×3 IMPLANT
BOOT STEPPER DURA LG (SOFTGOODS) IMPLANT
BOOT STEPPER DURA MED (SOFTGOODS) ×3 IMPLANT
BOOT STEPPER DURA SM (SOFTGOODS) IMPLANT
CHLORAPREP W/TINT 26ML (MISCELLANEOUS) ×3 IMPLANT
CLOSURE WOUND 1/2 X4 (GAUZE/BANDAGES/DRESSINGS) ×2
CLOTH BEACON ORANGE TIMEOUT ST (SAFETY) ×3 IMPLANT
COVER LIGHT HANDLE STERIS (MISCELLANEOUS) ×6 IMPLANT
CUFF TOURN SGL LL 12 (TOURNIQUET CUFF) IMPLANT
CUFF TOURNIQUET SINGLE 18IN (TOURNIQUET CUFF) ×3 IMPLANT
DECANTER SPIKE VIAL GLASS SM (MISCELLANEOUS) ×3 IMPLANT
DRAPE OEC MINIVIEW 54X84 (DRAPES) ×3 IMPLANT
DRESSING MEPILEX BORDER 6X8 (GAUZE/BANDAGES/DRESSINGS) ×1 IMPLANT
DRILL MICRO ACUTRAK 2 LNG PROF (BIT) ×3
DRSG ADAPTIC 3X8 NADH LF (GAUZE/BANDAGES/DRESSINGS) ×3 IMPLANT
DRSG MEPILEX BORDER 6X8 (GAUZE/BANDAGES/DRESSINGS) ×3
DURA STEPPER LG (CAST SUPPLIES) IMPLANT
DURA STEPPER MED (CAST SUPPLIES) IMPLANT
DURA STEPPER SML (CAST SUPPLIES) IMPLANT
DURA STEPPER XL (SOFTGOODS) IMPLANT
ELECT REM PT RETURN 9FT ADLT (ELECTROSURGICAL) ×3
ELECTRODE REM PT RTRN 9FT ADLT (ELECTROSURGICAL) ×1 IMPLANT
GAUZE KERLIX 2  STERILE LF (GAUZE/BANDAGES/DRESSINGS) ×3 IMPLANT
GAUZE SPONGE 4X4 12PLY STRL (GAUZE/BANDAGES/DRESSINGS) ×2 IMPLANT
GLOVE BIO SURGEON STRL SZ7.5 (GLOVE) ×3 IMPLANT
GOWN STRL REUS W/TWL LRG LVL3 (GOWN DISPOSABLE) ×9 IMPLANT
GUIDEWIRE ORTHO MICROSHT  ACUT (WIRE) ×2
GUIDEWIRE ORTHO MICROSHT .035 (WIRE) ×1 IMPLANT
KIT ROOM TURNOVER AP CYSTO (KITS) ×3 IMPLANT
MANIFOLD NEPTUNE II (INSTRUMENTS) ×3 IMPLANT
NEEDLE HYPO 27GX1-1/4 (NEEDLE) ×9 IMPLANT
NS IRRIG 1000ML POUR BTL (IV SOLUTION) ×3 IMPLANT
PACK BASIC LIMB (CUSTOM PROCEDURE TRAY) ×3 IMPLANT
PAD ARMBOARD 7.5X6 YLW CONV (MISCELLANEOUS) ×3 IMPLANT
RASP SM TEAR CROSS CUT (RASP) IMPLANT
SCREW ACUTRAK 2 MICRO 20MM (Screw) ×3 IMPLANT
SCREW QUICK SNAP 2.0X12MM (Screw) ×3 IMPLANT
SCREW SNAP OFF 2.0X10MM (Screw) ×3 IMPLANT
SET BASIN LINEN APH (SET/KITS/TRAYS/PACK) ×3 IMPLANT
SPONGE GAUZE 4X4 12PLY (GAUZE/BANDAGES/DRESSINGS) ×3 IMPLANT
SPONGE LAP 18X18 X RAY DECT (DISPOSABLE) ×3 IMPLANT
STAPLE FIXATION 8X8X8 (Staple) ×3 IMPLANT
STRIP CLOSURE SKIN 1/2X4 (GAUZE/BANDAGES/DRESSINGS) ×4 IMPLANT
SUT PROLENE 4 0 PS 2 18 (SUTURE) ×3 IMPLANT
SUT VIC AB 2-0 CT2 27 (SUTURE) ×3 IMPLANT
SUT VIC AB 4-0 PS2 27 (SUTURE) ×3 IMPLANT
SUT VICRYL AB 3-0 FS1 BRD 27IN (SUTURE) IMPLANT
SYR CONTROL 10ML LL (SYRINGE) ×9 IMPLANT

## 2014-04-11 NOTE — Anesthesia Procedure Notes (Signed)
Procedure Name: MAC Date/Time: 04/11/2014 7:34 AM Performed by: Andree Elk, AMY A Pre-anesthesia Checklist: Patient identified, Timeout performed, Emergency Drugs available, Suction available and Patient being monitored Oxygen Delivery Method: Simple face mask

## 2014-04-11 NOTE — Anesthesia Postprocedure Evaluation (Signed)
  Anesthesia Post-op Note  Patient: Venia Carbon  Procedure(s) Performed: Procedure(s): AUSTIN BUNIONECTOMY (Right) Barbie Banner OSTEOTOMY (Right) PERCUTANEOUS FLEXOR TENOTOMY 3RD DIGIT (Right) WEIL OSTEOTOMY 2ND METATARSAL RIGHT FOOT (Right)  Patient Location: PACU  Anesthesia Type:MAC  Level of Consciousness: awake, alert , oriented and patient cooperative  Airway and Oxygen Therapy: Patient Spontanous Breathing and Patient connected to nasal cannula oxygen  Post-op Pain: none  Post-op Assessment: Post-op Vital signs reviewed, Patient's Cardiovascular Status Stable, Respiratory Function Stable, Patent Airway and No signs of Nausea or vomiting  Post-op Vital Signs: Reviewed and stable  Last Vitals:  Filed Vitals:   04/11/14 0730  BP: 100/53  Temp:   Resp: 35    Complications: No apparent anesthesia complications

## 2014-04-11 NOTE — Op Note (Signed)
OPERATIVE NOTE  DATE OF PROCEDURE:  04/11/2014  SURGEON:   Marcheta Grammes, DPM  OR STAFF:   Circulator: Beckie Salts Page, RN Scrub Person: Jeffie Pollock, CST RN First Assistant: Cipriano Bunker, RN   PREOPERATIVE DIAGNOSIS:   1.  Hallux valgus, right foot (ICD-10 M20.11) 2.  Hammertoe deformity of the third digit, right foot (ICD-10 M20.41) 3.  Metatarsalgia of the second metatarsal, right foot (ICD-10 M77.41) 4.  Foot pain, right foot (ICD-10 M79.671)  POSTOPERATIVE DIAGNOSIS: Same  PROCEDURE: 1.  Austin bunionectomy, right foot (CPT [id number]) 2.  Akin osteotomy, right foot (CPT [id number]) 3.  Percutaneous flexor tenotomy of the third digit, right foot (CPT 28010) 4.  Weil osteotomy of the second metatarsal, right foot (CPT [phone])  ANESTHESIA:  Monitor Anesthesia Care   HEMOSTASIS:   Pneumatic ankle tourniquet set at 250 mmHg  ESTIMATED BLOOD LOSS:   Minimal (<5 cc)  MATERIALS USED:  1.  Acutrack 2 Headless Compression Screw Micro [20] mm length 2.  Stryker EasyClip [8] mm x [8] mm x [8] mm 3.  Integra The Spin Snap-Off Screw [2.0] mm x [10] mm  INJECTABLES: Marcaine 0.5% plain and Lidocaine 1% plain  PATHOLOGY:   None  COMPLICATIONS:   None  INDICATIONS:  Painful bunion deformity of the right foot, pain along the plantar aspect of the 2nd metatarsal head of the right foot and painful position of the right third toe.  DESCRIPTION OF THE PROCEDURE:   The patient was brought to the operating room and placed on the operative table in the supine position.  A pneumatic ankle tourniquet was applied to the patient's ankle.  Following sedation, the surgical site was anesthetized with 0.5% Marcaine plain.  The foot was then prepped, scrubbed, and draped in the usual sterile technique.  The foot was elevated, exsanguinated and the pneumatic ankle tourniquet inflated to 250 mmHg.    Procedure 1:  Alt[place]e [place], right foot (CPT [phone])  Attention was  then directed to the dorsomedial aspect of the first metatarsophalangeal joint where a 6-cm linear incision was made medial and parallel to the course of the extensor hallucis longus tendon.  The incision was deepened through subcutaneous tissues.  Vital neurovascular structures were identified and retracted.  All bleeders were identified and cauterized.  The incision was deepened to the level of the first metatarsophalangeal joint capsule.  An inverted L-type capsulotomy was performed over the dorsal aspect of the first metatarsophalangeal joint.  The capsular and periosteal structures were dissected free of their osseous attachments and reflected medially and laterally thus exposing the head of the first metatarsal at the operative site.  Utilizing a sagittal saw, the medial prominence was resected and passed from the operative field.  All rough edges were smoothed.    Attention was directed to the first interspace via the original skin incision.  Using sharp and blunt dissection, the fibular sesamoid was freed of its soft tissue attachments proximally, laterally and distally.  The conjoined tendon of the adductor hallucis muscle was identified and transected at its attachment to the base of the proximal phalanx of the hallux.  The lateral contracture present on the hallux was noted to be reduced and the sesamoid apparatus was noted to float into a more corrected medial position.    Attention was redirected to the medial aspect of the first metatarsal head where a through and through chevron osteotomy was created in the metaphyseal region of the first metatarsal bone using a sagittal  bone saw.  The apex of the osteotomy pointed distally.  Upon completion of the osteotomy the capital fragment was distracted and shifted laterally into a more corrected position.  A k-wire from the Acutrack screw set was inserted across the osteotomy site from a dorsal proximal to plantar distal direction.  An Acutrack headless  compression screw was inserted across the k-wire.  Adequate compression was noted.  The position of the screw was confirmed with fluoroscopy and found to be in acceptable alignment.  The remaining medial bone shelf was resected utilizing a sagittal bone saw and passed from the operative field.  The area was copiously irrigated.   Procedure 2: Akin osteotomy, right foot (CPT [id number])  The periosteal and capsular incisions were extended distally to allow for exposure of the proximal phalanx.  The periosteal structures were reviewed reflected medially and laterally thus exposing the proximal phalanx of the hallux at the operative site.  A wedge shaped osteotomy of the proximal phalanx was performed.  The apex was oriented laterally and the base medially.  The wedge of bone was removed and passed from the operative field.  The apex of the osteotomy was feathered to allow for adequate closure of the osteotomy.  An EasyClip staple was inserted across the osteotomy site.  The position of the staple was confirmed with fluoroscopy and found to be in acceptable alignment.  The periosteal and capsular tissues were reapproximated with 2-0 Vicryl.  The subcutaneous tissues were reapproximated with 4-0 Vicryl.  The skin was reapproximated with 4-0 Vicryl in a subcuticular manner.  The incision closure was reinforced with 4-0 Prolene in a simple suture technique.  Procedure 3:  Percutaneous flexor tenotomy of the third digit, right foot (CPT [phone])  Attention was directed to the distal aspect of the right third toe.  A flexible contracture of the distal interphalangeal joint was identified.  A percutaneous flexor tenotomy was performed using a #15 blade.  The contracture was evaluated and found to be reduced.  The area was copiously irrigated.  The incision was reapproximated using 4-0 Prolene.  Procedure 4:  Weil osteotomy of the second metatarsal, right foot (CPT [phone])  Attention was directed to the dorsal aspect of  the right second metatarsophalangeal joint.  A linear longitudinal incision was made lateral to the extensor digitorum longus tendon.  Dissection was continued deep using a #15 blade.  The extensor tendon was identified and retracted medially.  A transverse capsulotomy was performed.  A Weil osteotomy of the second metatarsal was performed using a power bone saw.  The capital fragment was translated proximally and fixated using an Integra screw.  The position of the screw was confirmed with fluoroscopy and found to be in acceptable alignment.  The area was irrigated with copious amounts of sterile irrigant.  The capsular incision was reapproximated using 4-0 Vicryl in a simple suture technique.  The subcutaneous structures were reapproximated using 4-0 Vicryl in a simple technique.  The skin was reapproximated using 4-0 Prolene in a simple suture technique.  All incisions were reinforced with Steri-Strips.  A sterile compressive dressing was applied to the operative foot.  The pneumatic ankle tourniquet was deflated and a prompt hyperemic response was noted to all digits of the operative foot.  The patient tolerated the procedure well.  The patient was then transferred to PACU with vital signs stable and vascular status intact to all toes of the operative foot.  Following a period of postoperative monitoring, the patient will be discharged  home.

## 2014-04-11 NOTE — Transfer of Care (Signed)
Immediate Anesthesia Transfer of Care Note  Patient: Jessica [name]  Procedure(s) Performed: Procedure(s): AUSTIN BUNIONECTOMY (Right) Barbie Banner OSTEOTOMY (Right) PERCUTANEOUS FLEXOR TENOTOMY 3RD DIGIT (Right) WEIL OSTEOTOMY 2ND METATARSAL RIGHT FOOT (Right)  Patient Location: PACU  Anesthesia Type:MAC  Level of Consciousness: awake, alert , oriented and patient cooperative  Airway & Oxygen Therapy: Patient Spontanous Breathing and Patient connected to nasal cannula oxygen  Post-op Assessment: Report given to PACU RN and Post -op Vital signs reviewed and stable  Post vital signs: Reviewed and stable  Complications: No apparent anesthesia complications

## 2014-04-11 NOTE — H&P (Signed)
HISTORY AND PHYSICAL INTERVAL NOTE:  04/11/2014  7:27 AM  Jessica [name]  has presented today for surgery, with the diagnosis of hallux valgus right foot, metatarsalgia of the 2nd metatarsal right foot, hammer toe deformity 3rd digit right foot.  The various methods of treatment have been discussed with the patient.  No guarantees were given.  After consideration of risks, benefits and other options for treatment, the patient has consented to surgery.  I have reviewed the patients' chart and labs.    Patient Vitals for the past 24 hrs:  BP Temp Resp SpO2  04/11/14 0640 (!) 100/41 mmHg - (!) 36 95 %  04/11/14 0624 - 97.9 F (36.6 C) - -    A history and physical examination was performed in my office.  The patient was reexamined.  There have been no changes to this history and physical examination.  Marcheta Grammes, DPM

## 2014-04-11 NOTE — Addendum Note (Signed)
Addended by: Caprice Beaver on: 04/11/2014 03:18 PM   Modules accepted: Orders

## 2014-04-11 NOTE — Anesthesia Preprocedure Evaluation (Signed)
Anesthesia Evaluation  Patient identified by MRN, date of birth, ID band Patient awake    Reviewed: Allergy & Precautions, H&P , NPO status , Patient's Chart, lab work & pertinent test results  Airway Mallampati: III  TM Distance: >3 FB   Mouth opening: Limited Mouth Opening  Dental  (+) Teeth Intact   Pulmonary neg pulmonary ROS,  breath sounds clear to auscultation        Cardiovascular negative cardio ROS  Rhythm:Regular Rate:Normal     Neuro/Psych PSYCHIATRIC DISORDERS Anxiety    GI/Hepatic GERD-  Medicated and Controlled,  Endo/Other  Hypothyroidism   Renal/GU      Musculoskeletal   Abdominal   Peds  Hematology   Anesthesia Other Findings   Reproductive/Obstetrics                             Anesthesia Physical Anesthesia Plan  ASA: II  Anesthesia Plan: MAC   Post-op Pain Management:    Induction: Intravenous  Airway Management Planned: Simple Face Mask  Additional Equipment:   Intra-op Plan:   Post-operative Plan:   Informed Consent: I have reviewed the patients History and Physical, chart, labs and discussed the procedure including the risks, benefits and alternatives for the proposed anesthesia with the patient or authorized representative who has indicated his/her understanding and acceptance.     Plan Discussed with:   Anesthesia Plan Comments:         Anesthesia Quick Evaluation

## 2014-04-12 ENCOUNTER — Encounter (HOSPITAL_COMMUNITY): Payer: Self-pay | Admitting: Podiatry

## 2015-01-17 ENCOUNTER — Other Ambulatory Visit (HOSPITAL_COMMUNITY): Payer: Self-pay

## 2015-02-18 ENCOUNTER — Other Ambulatory Visit (HOSPITAL_COMMUNITY): Payer: Self-pay

## 2015-02-20 ENCOUNTER — Ambulatory Visit (HOSPITAL_COMMUNITY): Admission: RE | Admit: 2015-02-20 | Payer: Self-pay | Source: Ambulatory Visit | Admitting: Podiatry

## 2015-02-20 ENCOUNTER — Encounter (HOSPITAL_COMMUNITY): Admission: RE | Payer: Self-pay | Source: Ambulatory Visit

## 2015-02-20 SURGERY — BUNIONECTOMY
Anesthesia: Monitor Anesthesia Care | Laterality: Right

## 2015-12-20 NOTE — Patient Instructions (Signed)
Your procedure is scheduled on: 12/25/2015               Report to [facility] at 8:50    AM.               Call this number if you have problems the morning of surgery: [phone]   Remember:   Do not drink or eat food:After Midnight.  :  Take these medicines the morning of surgery with A SIP OF WATER: Synthroid, Zoloft, Wellbutrin, Nexium and Xanax  Do not wear jewelry, make-up or nail polish.  Do not wear lotions, powders, or perfumes. You may wear deodorant.  Do not shave 48 hours prior to surgery. Men may shave face and neck.  Do not bring valuables to the hospital.  Contacts, dentures or bridgework may not be worn into surgery.  Leave suitcase in the car. After surgery it may be brought to your room.  For patients admitted to the hospital, checkout time is 11:00 AM the day of discharge.   Patients discharged the day of surgery will not be allowed to drive home.    Special Instructions: Shower using CHG night before surgery and shower the day of surgery use CHG.  Use special wash - you have one bottle of CHG for all showers.  You should use approximately 1/2 of the bottle for each shower.  Bunionectomy, Care After Refer to this sheet in the next few weeks. These instructions provide you with information about caring for yourself after your procedure. Your health care provider may also give you more specific instructions. Your treatment has been planned according to current medical practices, but problems sometimes occur. Call your health care provider if you have any problems or questions after your procedure. WHAT TO EXPECT AFTER YOUR PROCEDURE After your procedure, it is typical to have the following:  Pain.  Swelling. HOME CARE INSTRUCTIONS  Take medicines only as directed by your health care provider.  Apply ice to the affected area:  Put ice in a plastic bag.  Place a towel between your skin and the bag.  Leave the ice on for 20 minutes, 2-3 times a day.  There  are many different ways to close and cover an incision, including stitches (sutures), skin glue, and adhesive strips. Follow your health care provider's instructions about:  Incision care.  Bandage (dressing) changes and removal.  Incision closure removal.  Keep your foot raised (elevated) while you are resting.  You may need to wear a brace or a boot that keeps your foot in the proper position. Wear the boot or brace as directed by your health care provider.  Use crutches, canes, or walkers as directed by your health care provider.  Do not put weight on your foot until your health care provider approves.  Rest as needed. Follow your health care provider's instructions on when you can return to your usual activities, like walking and driving.  Do not wear high heels or tight-fitting shoes.  Do physical therapy exercises to strengthen your foot as directed by your health care provider. SEEK MEDICAL CARE IF:  You have increased bleeding from the incision.  You have drainage, redness, swelling, or pain at your incision site.  You have a fever or chills.  You notice a bad smell coming from the incision or the dressing.  Your dressing gets wet or falls off.  Your calf begins to swell.  Your toes feel numb or stiff. SEEK IMMEDIATE MEDICAL CARE IF:  You have a rash.  You have difficulty breathing.   This information is not intended to replace advice given to you by your health care provider. Make sure you discuss any questions you have with your health care provider.   Document Released: 10/17/2004 Document Revised: 04/20/2014 Document Reviewed: 11/15/2013 Elsevier Interactive Patient Education 2016 [place] Monitored anesthesia care is an anesthesia service for a medical procedure. Anesthesia is the loss of the ability to feel pain. It is produced by medicines called anesthetics. It may affect a small area of your body (local anesthesia), a  large area of your body (regional anesthesia), or your entire body (general anesthesia). The need for monitored anesthesia care depends your procedure, your condition, and the potential need for regional or general anesthesia. It is often provided during procedures where:   General anesthesia may be needed if there are complications. This is because you need special care when you are under general anesthesia.   You will be under local or regional anesthesia. This is so that you are able to have higher levels of anesthesia if needed.   You will receive calming medicines (sedatives). This is especially the case if sedatives are given to put you in a semi-conscious state of relaxation (deep sedation). This is because the amount of sedative needed to produce this state can be hard to predict. Too much of a sedative can produce general anesthesia. Monitored anesthesia care is performed by one or more health care providers who have special training in all types of anesthesia. You will need to meet with these health care providers before your procedure. During this meeting, they will ask you about your medical history. They will also give you instructions to follow. (For example, you will need to stop eating and drinking before your procedure. You may also need to stop or change medicines you are taking.) During your procedure, your health care providers will stay with you. They will:   Watch your condition. This includes watching your blood pressure, breathing, and level of pain.   Diagnose and treat problems that occur.   Give medicines if they are needed. These may include calming medicines (sedatives) and anesthetics.   Make sure you are comfortable.  Having monitored anesthesia care does not necessarily mean that you will be under anesthesia. It does mean that your health care providers will be able to manage anesthesia if you need it or if it occurs. It also means that you will be able to have a  different type of anesthesia than you are having if you need it. When your procedure is complete, your health care providers will continue to watch your condition. They will make sure any medicines wear off before you are allowed to go home.    This information is not intended to replace advice given to you by your health care provider. Make sure you discuss any questions you have with your health care provider.   Document Released: 12/24/2004 Document Revised: 04/20/2014 Document Reviewed: 05/11/2012 Elsevier Interactive Patient Education [employer].

## 2015-12-23 ENCOUNTER — Ambulatory Visit (HOSPITAL_COMMUNITY): Admission: RE | Admit: 2015-12-23 | Payer: BLUE CROSS/BLUE SHIELD | Source: Ambulatory Visit

## 2015-12-23 ENCOUNTER — Other Ambulatory Visit: Payer: Self-pay

## 2015-12-23 ENCOUNTER — Encounter (HOSPITAL_COMMUNITY): Payer: Self-pay

## 2015-12-23 ENCOUNTER — Encounter (HOSPITAL_COMMUNITY)
Admission: RE | Admit: 2015-12-23 | Discharge: 2015-12-23 | Disposition: A | Discharge status: Home / Self Care | Payer: BLUE CROSS/BLUE SHIELD | Source: Ambulatory Visit | Attending: Podiatry | Admitting: Podiatry

## 2015-12-23 ENCOUNTER — Ambulatory Visit (HOSPITAL_COMMUNITY)
Admission: RE | Admit: 2015-12-23 | Discharge: 2015-12-23 | Disposition: A | Discharge status: Home / Self Care | Payer: BLUE CROSS/BLUE SHIELD | Source: Ambulatory Visit | Attending: Podiatry | Admitting: Podiatry

## 2015-12-23 ENCOUNTER — Other Ambulatory Visit (HOSPITAL_COMMUNITY): Payer: Self-pay | Admitting: Podiatry

## 2015-12-23 DIAGNOSIS — M2011 Hallux valgus (acquired), right foot: Secondary | ICD-10-CM | POA: Diagnosis present

## 2015-12-23 DIAGNOSIS — Z9889 Other specified postprocedural states: Secondary | ICD-10-CM | POA: Insufficient documentation

## 2015-12-23 LAB — CBC
HEMATOCRIT: 40.9 % (ref 36.0–46.0)
HEMOGLOBIN: 13 g/dL (ref 12.0–15.0)
MCH: 29.1 pg (ref 26.0–34.0)
MCHC: 31.8 g/dL (ref 30.0–36.0)
MCV: 91.7 fL (ref 78.0–100.0)
Platelets: 200 10*3/uL (ref 150–400)
RBC: 4.46 MIL/uL (ref 3.87–5.11)
RDW: 14.3 % (ref 11.5–15.5)
WBC: 5.9 10*3/uL (ref 4.0–10.5)

## 2015-12-23 LAB — BASIC METABOLIC PANEL
Anion gap: 7 (ref 5–15)
BUN: 16 mg/dL (ref 6–20)
CHLORIDE: 109 mmol/L (ref 101–111)
CO2: 25 mmol/L (ref 22–32)
Calcium: 9.3 mg/dL (ref 8.9–10.3)
Creatinine, Ser: 1.03 mg/dL — ABNORMAL HIGH (ref 0.44–1.00)
GFR calc Af Amer: >60 mL/min (ref >60)
GFR calc non Af Amer: 58 mL/min — ABNORMAL LOW (ref >60)
GLUCOSE: 79 mg/dL (ref 65–99)
POTASSIUM: 3.9 mmol/L (ref 3.5–5.1)
Sodium: 141 mmol/L (ref 135–145)

## 2015-12-25 ENCOUNTER — Ambulatory Visit (HOSPITAL_COMMUNITY)
Admission: RE | Admit: 2015-12-25 | Discharge: 2015-12-25 | Disposition: A | Discharge status: Home / Self Care | Payer: BLUE CROSS/BLUE SHIELD | Source: Ambulatory Visit | Attending: Podiatry | Admitting: Podiatry

## 2015-12-25 ENCOUNTER — Ambulatory Visit (HOSPITAL_COMMUNITY): Payer: BLUE CROSS/BLUE SHIELD | Admitting: Anesthesiology

## 2015-12-25 ENCOUNTER — Ambulatory Visit (HOSPITAL_COMMUNITY): Payer: BLUE CROSS/BLUE SHIELD

## 2015-12-25 ENCOUNTER — Encounter (HOSPITAL_COMMUNITY)
Admission: RE | Disposition: A | Discharge status: Home / Self Care | Payer: Self-pay | Source: Ambulatory Visit | Attending: Podiatry

## 2015-12-25 DIAGNOSIS — Z7982 Long term (current) use of aspirin: Secondary | ICD-10-CM | POA: Insufficient documentation

## 2015-12-25 DIAGNOSIS — M2011 Hallux valgus (acquired), right foot: Secondary | ICD-10-CM | POA: Diagnosis not present

## 2015-12-25 DIAGNOSIS — M7731 Calcaneal spur, right foot: Secondary | ICD-10-CM | POA: Insufficient documentation

## 2015-12-25 DIAGNOSIS — Z79899 Other long term (current) drug therapy: Secondary | ICD-10-CM | POA: Diagnosis not present

## 2015-12-25 DIAGNOSIS — K219 Gastro-esophageal reflux disease without esophagitis: Secondary | ICD-10-CM | POA: Insufficient documentation

## 2015-12-25 DIAGNOSIS — Z9889 Other specified postprocedural states: Secondary | ICD-10-CM

## 2015-12-25 DIAGNOSIS — E039 Hypothyroidism, unspecified: Secondary | ICD-10-CM | POA: Insufficient documentation

## 2015-12-25 DIAGNOSIS — M25571 Pain in right ankle and joints of right foot: Secondary | ICD-10-CM | POA: Insufficient documentation

## 2015-12-25 DIAGNOSIS — F419 Anxiety disorder, unspecified: Secondary | ICD-10-CM | POA: Diagnosis not present

## 2015-12-25 HISTORY — PX: BUNIONECTOMY: SHX129

## 2015-12-25 HISTORY — PX: HALLUX VALGUS BASE WEDGE: SHX6624

## 2015-12-25 SURGERY — HALLUX VALGUS BASE WEDGE
Anesthesia: Monitor Anesthesia Care | Site: Foot | Laterality: Right

## 2015-12-25 MED ORDER — ACETAMINOPHEN 325 MG PO TABS
ORAL_TABLET | ORAL | Status: AC
  Filled 2015-12-25: qty 2

## 2015-12-25 MED ORDER — FENTANYL CITRATE (PF) 100 MCG/2ML IJ SOLN
25.0000 ug | INTRAMUSCULAR | Status: AC | PRN
  Administered 2015-12-25 (×2): 25 ug via INTRAVENOUS

## 2015-12-25 MED ORDER — MIDAZOLAM HCL 5 MG/5ML IJ SOLN
INTRAMUSCULAR | Status: DC | PRN
  Administered 2015-12-25: 2 mg via INTRAVENOUS
  Administered 2015-12-25 (×2): 1 mg via INTRAVENOUS

## 2015-12-25 MED ORDER — MIDAZOLAM HCL 2 MG/2ML IJ SOLN
INTRAMUSCULAR | Status: AC
  Filled 2015-12-25: qty 2

## 2015-12-25 MED ORDER — BUPIVACAINE HCL (PF) 0.5 % IJ SOLN
INTRAMUSCULAR | Status: AC
  Filled 2015-12-25: qty 30

## 2015-12-25 MED ORDER — PROPOFOL 10 MG/ML IV BOLUS
INTRAVENOUS | Status: AC
  Filled 2015-12-25: qty 20

## 2015-12-25 MED ORDER — SODIUM CHLORIDE 0.9 % IR SOLN
Status: DC | PRN
  Administered 2015-12-25: 500 mL

## 2015-12-25 MED ORDER — PROPOFOL 500 MG/50ML IV EMUL
INTRAVENOUS | Status: DC | PRN
  Administered 2015-12-25: 45 ug/kg/min via INTRAVENOUS
  Administered 2015-12-25: 11:00:00 via INTRAVENOUS
  Administered 2015-12-25: 75 ug/kg/min via INTRAVENOUS

## 2015-12-25 MED ORDER — BUPIVACAINE HCL (PF) 0.5 % IJ SOLN
INTRAMUSCULAR | Status: DC | PRN
  Administered 2015-12-25: 30 mL

## 2015-12-25 MED ORDER — ONDANSETRON HCL 4 MG/2ML IJ SOLN
4.0000 mg | Freq: Once | INTRAMUSCULAR | Status: AC
  Administered 2015-12-25: 4 mg via INTRAVENOUS

## 2015-12-25 MED ORDER — ACETAMINOPHEN 325 MG PO TABS
650.0000 mg | ORAL_TABLET | Freq: Once | ORAL | Status: AC
Start: 2015-12-25 — End: 2015-12-25
  Administered 2015-12-25: 650 mg via ORAL

## 2015-12-25 MED ORDER — FENTANYL CITRATE (PF) 100 MCG/2ML IJ SOLN
INTRAMUSCULAR | Status: AC
  Filled 2015-12-25: qty 2

## 2015-12-25 MED ORDER — CEFAZOLIN SODIUM-DEXTROSE 2-4 GM/100ML-% IV SOLN
2.0000 g | INTRAVENOUS | Status: AC
  Administered 2015-12-25: 2 g via INTRAVENOUS
  Filled 2015-12-25: qty 100

## 2015-12-25 MED ORDER — FENTANYL CITRATE (PF) 100 MCG/2ML IJ SOLN
INTRAMUSCULAR | Status: DC | PRN
  Administered 2015-12-25: 12.5 ug via INTRAVENOUS
  Administered 2015-12-25: 25 ug via INTRAVENOUS
  Administered 2015-12-25: 12.5 ug via INTRAVENOUS
  Administered 2015-12-25 (×2): 25 ug via INTRAVENOUS

## 2015-12-25 MED ORDER — CHLORHEXIDINE GLUCONATE CLOTH 2 % EX PADS: 6.0000 | MEDICATED_PAD | Freq: Once | CUTANEOUS | Status: DC

## 2015-12-25 MED ORDER — ONDANSETRON HCL 4 MG/2ML IJ SOLN
INTRAMUSCULAR | Status: AC
  Filled 2015-12-25: qty 2

## 2015-12-25 MED ORDER — LACTATED RINGERS IV SOLN
INTRAVENOUS | Status: DC
  Administered 2015-12-25: 09:00:00 via INTRAVENOUS

## 2015-12-25 MED ORDER — MIDAZOLAM HCL 2 MG/2ML IJ SOLN
1.0000 mg | INTRAMUSCULAR | Status: DC | PRN
  Administered 2015-12-25: 2 mg via INTRAVENOUS

## 2015-12-25 MED ORDER — HYDROMORPHONE HCL 1 MG/ML IJ SOLN: 0.2500 mg | INTRAMUSCULAR | Status: DC | PRN

## 2015-12-25 SURGICAL SUPPLY — 52 items
BAG HAMPER (MISCELLANEOUS) ×2 IMPLANT
BANDAGE ELASTIC 3 LF NS (GAUZE/BANDAGES/DRESSINGS) ×2 IMPLANT
BANDAGE ELASTIC 4 VELCRO NS (GAUZE/BANDAGES/DRESSINGS) ×2 IMPLANT
BANDAGE ESMARK 4X12 BL STRL LF (DISPOSABLE) ×1 IMPLANT
BENZOIN TINCTURE PRP APPL 2/3 (GAUZE/BANDAGES/DRESSINGS) ×4 IMPLANT
BIT DRILL MICR ACTRK 2 LNG PRF (BIT) ×1 IMPLANT
BLADE 15 SAFETY STRL DISP (BLADE) ×4 IMPLANT
BLADE AVERAGE 25X9 (BLADE) ×4 IMPLANT
BNDG CONFORM 2 STRL LF (GAUZE/BANDAGES/DRESSINGS) ×2 IMPLANT
BNDG ESMARK 4X12 BLUE STRL LF (DISPOSABLE) ×2
BNDG GAUZE ELAST 4 BULKY (GAUZE/BANDAGES/DRESSINGS) ×2 IMPLANT
BOOT STEPPER DURA LG (SOFTGOODS) IMPLANT
BOOT STEPPER DURA MED (SOFTGOODS) ×2 IMPLANT
BOOT STEPPER DURA SM (SOFTGOODS) IMPLANT
BOOT STEPPER DURA XLG (SOFTGOODS) IMPLANT
CHLORAPREP W/TINT 26ML (MISCELLANEOUS) ×2 IMPLANT
CLIP EZ FIXATION 10X10X10 (Staple) ×4 IMPLANT
CLOTH BEACON ORANGE TIMEOUT ST (SAFETY) ×2 IMPLANT
COVER LIGHT HANDLE STERIS (MISCELLANEOUS) ×4 IMPLANT
CUFF TOURNIQUET SINGLE 18IN (TOURNIQUET CUFF) ×2 IMPLANT
DECANTER SPIKE VIAL GLASS SM (MISCELLANEOUS) ×2 IMPLANT
DRAPE HALF SHEET 40X57 (DRAPES) ×2 IMPLANT
DRAPE OEC MINIVIEW 54X84 (DRAPES) ×2 IMPLANT
DRILL MICRO ACUTRAK 2 LNG PROF (BIT) ×2
DRSG ADAPTIC 3X8 NADH LF (GAUZE/BANDAGES/DRESSINGS) ×2 IMPLANT
ELECT REM PT RETURN 9FT ADLT (ELECTROSURGICAL) ×2
ELECTRODE REM PT RTRN 9FT ADLT (ELECTROSURGICAL) ×1 IMPLANT
GAUZE PETROLATUM 1 X8 (GAUZE/BANDAGES/DRESSINGS) ×2 IMPLANT
GAUZE SPONGE 4X4 12PLY STRL (GAUZE/BANDAGES/DRESSINGS) ×2 IMPLANT
GLOVE BIO SURGEON STRL SZ7.5 (GLOVE) ×2 IMPLANT
GLOVE BIOGEL PI IND STRL 7.0 (GLOVE) ×1 IMPLANT
GLOVE BIOGEL PI INDICATOR 7.0 (GLOVE) ×1
GOWN STRL REUS W/TWL LRG LVL3 (GOWN DISPOSABLE) ×6 IMPLANT
GUIDEWIRE ORTHO MICROSHT  ACUT (WIRE) ×1
GUIDEWIRE ORTHO MICROSHT .035 (WIRE) ×1 IMPLANT
KIT ROOM TURNOVER AP CYSTO (KITS) ×2 IMPLANT
KWIRE 4.0 X .035IN (WIRE) ×2 IMPLANT
MANIFOLD NEPTUNE II (INSTRUMENTS) ×2 IMPLANT
NEEDLE HYPO 27GX1-1/4 (NEEDLE) ×6 IMPLANT
NS IRRIG 1000ML POUR BTL (IV SOLUTION) ×2 IMPLANT
PACK BASIC LIMB (CUSTOM PROCEDURE TRAY) ×2 IMPLANT
PAD ARMBOARD 7.5X6 YLW CONV (MISCELLANEOUS) ×2 IMPLANT
RASP SM TEAR CROSS CUT (RASP) IMPLANT
SCREW ACUTRAK 2 MICRO 20MM (Screw) ×2 IMPLANT
SET BASIN LINEN APH (SET/KITS/TRAYS/PACK) ×2 IMPLANT
SPONGE LAP 18X18 X RAY DECT (DISPOSABLE) ×2 IMPLANT
STRIP CLOSURE SKIN 1/2X4 (GAUZE/BANDAGES/DRESSINGS) ×2 IMPLANT
SUT PROLENE 4 0 PS 2 18 (SUTURE) IMPLANT
SUT VIC AB 2-0 CT2 27 (SUTURE) ×2 IMPLANT
SUT VIC AB 4-0 PS2 27 (SUTURE) ×2 IMPLANT
SUT VICRYL AB 3-0 FS1 BRD 27IN (SUTURE) ×4 IMPLANT
SYR CONTROL 10ML LL (SYRINGE) ×6 IMPLANT

## 2015-12-25 NOTE — Discharge Instructions (Signed)
These instructions will give you an idea of what to expect after surgery and how to manage issues that may arise before your first post op office visit.  Pain Management Pain is best managed by staying ahead of it. If pain gets out of control, it is difficult to get it back under control. Local anesthesia that lasts 6-8 hours is used to numb the foot and decrease pain.  For the best pain control, take the pain medication every 4 hours for the first 2 days post op. On the third day pain medication can be taken as needed.   Post Op Nausea Nausea is common after surgery, so it is managed proactively.  If prescribed, use the prescribed nausea medication regularly for the first 2 days post op.  Bandages Do not worry if there is blood on the bandage. What looks like a lot of blood on the bandage is actually a small amount. Blood on the dressing spreads out as it is absorbed by the gauze, the same way a drop of water spreads out on a paper towel.  If the bandages feel wet or dry, stiff and uncomfortable, call the office during office hours and we will schedule a time for you to have the bandage changed.  Unless you are specifically told otherwise, we will do the first bandage change in the office.  Keep your bandage dry. If the bandage becomes wet or soiled, notify the office and we will schedule a time to change the bandage.  Activity It is best to spend most of the first 2 days after surgery lying down with the foot elevated above the level of your heart. You may put weight on your heel while wearing the surgical boot.   You may only get up to go to the restroom.  Driving Do not drive until you are able to respond in an emergency (i.e. slam on the brakes). This usually occurs after the bone has healed - 6 to 8 weeks.  Call the Office If you have a fever over 101F.  If you have increasing pain after the initial post op pain has settled down.  If you have increasing redness, swelling, or  drainage.  If you have any questions or concerns.

## 2015-12-25 NOTE — Brief Op Note (Signed)
BRIEF OPERATIVE NOTE  DATE OF PROCEDURE 12/25/2015  SURGEON Marcheta Grammes, [place]  OR STAFF Circulator: Venda Rodes, RN Scrub Person: Karin Lieu, CST RN First Assistant: Towanda Malkin, RN   PREOPERATIVE DIAGNOSIS 1.  Recurrent hallux valgus, right foot 2.  Pain, right foot  POSTOPERATIVE DIAGNOSIS Same  PROCEDURE 1.  Closing base wedge osteotomy of first metatarsal, right foot 2.  Revision of Austin bunionectomy, right foot  ANESTHESIA Monitor Anesthesia Care   HEMOSTASIS Pneumatic ankle tourniquet set at 250 mmHg  ESTIMATED BLOOD LOSS Minimal (<5 cc)  MATERIALS USED 1.  EasyClip staple x 2 2.  AcuMed screw  INJECTABLES 0.5% Marcaine plain  PATHOLOGY None  COMPLICATIONS None

## 2015-12-25 NOTE — Transfer of Care (Signed)
Immediate Anesthesia Transfer of Care Note  Patient: Jessica [name]  Procedure(s) Performed: Procedure(s): CLOSING BASE WEDGE OSTEOTOMY (Right) REVISION AUSTIN BUNIONECTOMY (Right)  Patient Location: PACU  Anesthesia Type:MAC  Level of Consciousness: awake, alert , oriented and patient cooperative  Airway & Oxygen Therapy: Patient Spontanous Breathing and Patient connected to nasal cannula oxygen  Post-op Assessment: Report given to RN, Post -op Vital signs reviewed and stable and Patient moving all extremities  Post vital signs: Reviewed and stable  Last Vitals:  Vitals:   12/25/15 1020 12/25/15 1025  BP: 129/74 129/66  Pulse:    Resp: (!) 24 20  Temp:      Last Pain:  Vitals:   12/25/15 0854  TempSrc: Oral      Patients Stated Pain Goal: 7 [id number])  Complications: No apparent anesthesia complications

## 2015-12-25 NOTE — Anesthesia Preprocedure Evaluation (Signed)
Anesthesia Evaluation  Patient identified by MRN, date of birth, ID band Patient awake    Reviewed: Allergy & Precautions, H&P , NPO status , Patient's Chart, lab work & pertinent test results  Airway Mallampati: III  TM Distance: >3 FB   Mouth opening: Limited Mouth Opening  Dental  (+) Teeth Intact   Pulmonary neg pulmonary ROS,  breath sounds clear to auscultation        Cardiovascular negative cardio ROS  Rhythm:Regular Rate:Normal     Neuro/Psych PSYCHIATRIC DISORDERS Anxiety    GI/Hepatic GERD-  Medicated and Controlled,  Endo/Other  Hypothyroidism   Renal/GU      Musculoskeletal   Abdominal   Peds  Hematology   Anesthesia Other Findings   Reproductive/Obstetrics                             Anesthesia Physical Anesthesia Plan  ASA: II  Anesthesia Plan: MAC   Post-op Pain Management:    Induction: Intravenous  Airway Management Planned: Simple Face Mask  Additional Equipment:   Intra-op Plan:   Post-operative Plan:   Informed Consent: I have reviewed the patients History and Physical, chart, labs and discussed the procedure including the risks, benefits and alternatives for the proposed anesthesia with the patient or authorized representative who has indicated his/her understanding and acceptance.     Plan Discussed with:   Anesthesia Plan Comments:         Anesthesia Quick Evaluation  

## 2015-12-25 NOTE — H&P (Signed)
HISTORY AND PHYSICAL INTERVAL NOTE:  12/25/2015  9:56 AM  Jessica [name]  has presented today for surgery, with the diagnosis of hallux valgus.  The various methods of treatment have been discussed with the patient.  No guarantees were given.  After consideration of risks, benefits and other options for treatment, the patient has consented to surgery.  I have reviewed the patients' chart and labs.    Patient Vitals for the past 24 hrs:  BP Temp Temp src Pulse Resp SpO2  12/25/15 0854 (!) 116/58 97.9 F (36.6 C) Oral (!) 55 16 95 %    A history and physical examination was performed in my office.  The patient was reexamined.  There have been no changes to this history and physical examination.  Marcheta Grammes, DPM

## 2015-12-25 NOTE — Op Note (Signed)
OPERATIVE NOTE  DATE OF PROCEDURE 12/25/2015  SURGEON Marcheta Grammes, [place]  OR STAFF Circulator: Venda Rodes, RN Scrub Person: Karin Lieu, CST RN First Assistant: Towanda Malkin, RN   PREOPERATIVE DIAGNOSIS 1.  Recurrent hallux valgus, right foot 2.  Pain, right foot  POSTOPERATIVE DIAGNOSIS Same  PROCEDURE 1.  Closing base wedge osteotomy of first metatarsal, right foot 2.  Revision of Austin bunionectomy, right foot  ANESTHESIA Monitor Anesthesia Care   HEMOSTASIS Pneumatic ankle tourniquet set at 250 mmHg  ESTIMATED BLOOD LOSS Minimal (<5 cc)  MATERIALS USED 1.  EasyClip staple x 2 2.  AcuMed screw  INJECTABLES 0.5% Marcaine plain  PATHOLOGY None  COMPLICATIONS None  INDICATIONS:  Recurrent, painful bunion deformity of the right foot.  DESCRIPTION OF THE PROCEDURE:  The patient was brought to the operating room and placed on the operative table in the supine position.  A pneumatic ankle tourniquet was applied to the operative extremity.  Following sedation, the surgical site was anesthetized with 0.5% Marcaine plain.  The foot was then prepped, scrubbed, and draped in the usual sterile technique.  The foot was elevated, exsanguinated and the pneumatic ankle tourniquet inflated to 250 mmHg.    Attention was directed to the dorsal medial aspect of the right foot where 2 converging semielliptical incisions were made encompassing a surgical scar in its entirety.  The wedge of skin was removed and passed from the operative field.  Dissection was continued deep down to the level of the first MPJ distally and base of the first metatarsal proximally.  A screw in the distal first metatarsal was identified and removed without difficulty.  The screw was passed from the operative field.  Attention was directed to the base of the first metatarsal.  A 0.045 inch Kirschner wire was inserted approximately 1 cm distal to the first metatarsocuneiform joint to serve as a  hinge axis.  A wedge-shaped osteotomy was performed in the base of the first metatarsal with the apex oriented medially and the base laterally.  The wedge of bone was removed.  The hinge was feathered to allow for adequate closure of the osteotomy.  The osteotomy was fixated with 2 Stryker EasyClip Staples.  Reduction of the first intermetatarsal angle and position of the hardware was evaluated with C-arm fluoroscopy.  Persistent lateral deviation of the hallux at the MPJ was noted.  A chevron osteotomy was performed in the distal first metatarsal.  The capital fragment was retracted and shifted laterally.  The osteotomy was fixated with an Acumed screw.  The dorsal aspect of the first metatarsal head was resected due to the potential for elevatus of the first ray following a base wedge osteotomy to prevent a decrease in first MPJ dorsiflexion.  A medial capsulorrhaphy was performed.  The surgical field was irrigated with copious amounts of sterile irrigant.  Periosteal capsular structures were reapproximated using 2-0 and 3-0 Vicryl.  The subcutaneous structures were reapproximated using 4-0 Vicryl.  The skin was reapproximated using 4-0 Vicryl in a running subcuticular manner.  The skin closure was reinforced with Steri-Strips.  A sterile compressive dressing was applied to the operative foot.  The pneumatic ankle tourniquet was deflated and a prompt hyperemic response was noted to all digits of the right foot.   The patient tolerated the procedure well.  The patient was then transferred to PACU with vital signs stable and vascular status intact to all toes of the operative foot.

## 2015-12-25 NOTE — Anesthesia Postprocedure Evaluation (Signed)
Anesthesia Post Note  Patient: Jessica [name]  Procedure(s) Performed: Procedure(s) (LRB): CLOSING BASE WEDGE OSTEOTOMY (Right) REVISION [place]UST[place] BUNIONECTOMY (Right)  Patient location during evaluation: PACU Anesthesia Type: MAC Level of consciousness: awake, awake and alert, oriented and patient cooperative Pain management: pain level controlled Vital Signs Assessment: post-procedure vital signs reviewed and stable Respiratory status: spontaneous breathing, nonlabored ventilation and respiratory function stable Cardiovascular status: blood pressure returned to baseline and stable Postop Assessment: no signs of nausea or vomiting Anesthetic complications: no    Last Vitals:  Vitals:   12/25/15 1025 12/25/15 1245  BP: 129/66 117/72  Pulse:  (!) 59  Resp: 20 11  Temp:  36.7 C    Last Pain:  Vitals:   12/25/15 0854  TempSrc: Oral                 Ules Marsala J

## 2016-01-01 ENCOUNTER — Encounter (HOSPITAL_COMMUNITY): Payer: Self-pay | Admitting: Podiatry

## 2016-12-01 ENCOUNTER — Encounter: Payer: Self-pay | Admitting: *Deleted

## 2016-12-02 ENCOUNTER — Encounter: Payer: Self-pay | Admitting: Internal Medicine

## 2016-12-02 ENCOUNTER — Ambulatory Visit (INDEPENDENT_AMBULATORY_CARE_PROVIDER_SITE_OTHER): Payer: BLUE CROSS/BLUE SHIELD | Admitting: Internal Medicine

## 2016-12-02 ENCOUNTER — Encounter (INDEPENDENT_AMBULATORY_CARE_PROVIDER_SITE_OTHER): Payer: Self-pay

## 2016-12-02 VITALS — BP 110/70 | HR 74 | Ht 66.0 in | Wt 187.0 lb

## 2016-12-02 DIAGNOSIS — Z1211 Encounter for screening for malignant neoplasm of colon: Secondary | ICD-10-CM

## 2016-12-02 DIAGNOSIS — K219 Gastro-esophageal reflux disease without esophagitis: Secondary | ICD-10-CM | POA: Diagnosis not present

## 2016-12-02 DIAGNOSIS — K5909 Other constipation: Secondary | ICD-10-CM | POA: Diagnosis not present

## 2016-12-02 MED ORDER — LINACLOTIDE 290 MCG PO CAPS: 290.0000 ug | ORAL_CAPSULE | Freq: Every day | ORAL | 1 refills | Status: DC

## 2016-12-02 MED ORDER — SUPREP BOWEL PREP KIT 17.5-3.13-1.6 GM/177ML PO SOLN: ORAL | 0 refills | Status: DC

## 2016-12-02 NOTE — Patient Instructions (Signed)
You have been scheduled for a colonoscopy. Please follow written instructions given to you at your visit today.  Please pick up your prep supplies at the pharmacy within the next 1-3 days. If you use inhalers (even only as needed), please bring them with you on the day of your procedure. Your physician has requested that you go to www.startemmi.com and enter the access code given to you at your visit today. This web site gives a general overview about your procedure. However, you should still follow specific instructions given to you by our office regarding your preparation for the procedure.  If you are age 82 or older, your body mass index should be between 23-30. Your Body mass index is 30.18 kg/m. If this is out of the aforementioned range listed, please consider follow up with your Primary Care Provider.  If you are age 21 or younger, your body mass index should be between 19-25. Your Body mass index is 30.18 kg/m. If this is out of the aformentioned range listed, please consider follow up with your Primary Care Provider.    We have sent the following medications to your pharmacy for you to pick up at your convenience: Linzess 281mcg (We have given you samples today)

## 2016-12-02 NOTE — Progress Notes (Signed)
Patient ID: Darianna Amy, female   DOB: 1955-09-30, 61 y.o.   MRN: [id number] HPI: Antoinett Dorman is a 61 year old female with past medical history of GERD, chronic constipation, hyperlipidemia, hypothyroidism, anxiety and depression who is seen in consultation at the request of Dr. Kenton Kingfisher to evaluate chronic constipation and also consider colon cancer screening. She is here alone today.  The patient reports chronic "lifelong" constipation. She averages a bowel movement about once per week. She is tried multiple prior medications for constipation including Linzess which she seems to remember being ineffective, Amitiza which resulted in morning nausea and was discontinued. Prior attempted MiraLAX and Metamucil was unsuccessful. She denies seeing blood in her stool and melena. Over the last 3-4 weeks she's noticed a change slightly in her bowel movements and that her stools are more soft and she's had increased bowel gas though she's not had increased frequency of stool. Still averaging about 1 stool per week. This all started around the time that she began high-dose vitamin D for vitamin D deficiency.  She has a history of reflux and takes Nexium 40 mg daily. She has been on this for some time. No dysphagia or odynophagia. No breakthrough heartburn, nausea or vomiting.  Family history notable for a maternal grandfather with colon cancer. No first-degree relatives with colon cancer.  Her last colonoscopy was 09/09/2010 performed by Dr. West Carbo. This reveals no polypoid disease. He noted "somewhat less than I dill for prep.". Redundant right colon. 5 year recall recommended  Last EGD 09/09/2010 -- hiatus hernia and lower esophageal ring dilated with Maloney bougies #54 and #56.  Past Medical History:  Diagnosis Date  . Anxiety   . Depression   . Esophageal stricture   . GERD (gastroesophageal reflux disease)   . Hiatal hernia   . Hyperlipidemia   . Hypertrophy of breast 01/2014  .  Hypothyroidism   . TMJ syndrome     Past Surgical History:  Procedure Laterality Date  . Barbie Banner OSTEOTOMY Right 04/11/2014   Procedure: Barbie Banner OSTEOTOMY;  Surgeon: Marcheta Grammes, DPM;  Location: [facility];  Service: Podiatry;  Laterality: Right;  . BREAST REDUCTION SURGERY Bilateral 01/16/2014   Procedure: BILATERAL MAMMARY REDUCTION  (BREAST);  Surgeon: Charlene Brooke, MD;  Location: [place];  Service: Plastics;  Laterality: Bilateral;  . BUNIONECTOMY Right 04/11/2014   Procedure: Altamese Saticoy;  Surgeon: Marcheta Grammes, DPM;  Location: [facility];  Service: Podiatry;  Laterality: Right;  . BUNIONECTOMY Right 12/25/2015   Procedure: REVISION Altamese Uriah;  Surgeon: Caprice Beaver, DPM;  Location: [facility];  Service: Podiatry;  Laterality: Right;  . COLONOSCOPY    . FLEXOR TENOTOMY Right 04/11/2014   Procedure: PERCUTANEOUS FLEXOR TENOTOMY 3RD DIGIT;  Surgeon: Marcheta Grammes, DPM;  Location: [facility];  Service: Podiatry;  Laterality: Right;  . HALLUX VALGUS BASE WEDGE Right 12/25/2015   Procedure: CLOSING BASE WEDGE OSTEOTOMY;  Surgeon: Caprice Beaver, DPM;  Location: [facility];  Service: Podiatry;  Laterality: Right;  . WEIL OSTEOTOMY Right 04/11/2014   Procedure: WEIL OSTEOTOMY 2ND METATARSAL RIGHT FOOT;  Surgeon: Marcheta Grammes, DPM;  Location: [facility];  Service: Podiatry;  Laterality: Right;    Outpatient Medications Prior to Visit  Medication Sig Dispense Refill  . aspirin EC 81 MG tablet Take 81 mg by mouth daily.    Marland Kitchen buPROPion (WELLBUTRIN XL) 150 MG 24 hr tablet Take 150 mg by mouth daily.    Marland Kitchen esomeprazole (NEXIUM) 40 MG capsule Take 40 mg  by mouth daily.     Marland Kitchen levothyroxine (SYNTHROID, LEVOTHROID) 88 MCG tablet Take 88 mcg by mouth daily before breakfast.    . sertraline (ZOLOFT) 100 MG tablet Take 100 mg by mouth daily.     Marland Kitchen ALPRAZolam (XANAX) 0.5 MG tablet Take 0.5 mg by mouth at bedtime as needed for anxiety.    Marland Kitchen  levothyroxine (SYNTHROID, LEVOTHROID) 75 MCG tablet Take 75 mcg by mouth daily before breakfast.    . lurasidone (LATUDA) 20 MG TABS tablet Take 20 mg by mouth daily.     No facility-administered medications prior to visit.     No Known Allergies  Family History  Problem Relation Age of Onset  . Stroke Mother   . Heart disease Father   . Other Son 21       suicide  . Colon cancer Maternal Grandfather   . Throat cancer Neg Hx     Social History  Substance Use Topics  . Smoking status: Never Smoker  . Smokeless tobacco: Never Used  . Alcohol use Yes     Comment: occasionally    ROS: As per history of present illness, otherwise negative  BP 110/70   Pulse 74   Ht 5\' 6"  (1.676 m)   Wt 187 lb (84.8 kg)   BMI 30.18 kg/m  Constitutional: Well-developed and well-nourished. No distress. HEENT: Normocephalic and atraumatic. Oropharynx is clear and moist. Conjunctivae are normal.  No scleral icterus. Neck: Neck supple. Trachea midline. Cardiovascular: Normal rate, regular rhythm and intact distal pulses. No M/R/G Pulmonary/chest: Effort normal and breath sounds normal. No wheezing, rales or rhonchi. Abdominal: Soft, nontender, nondistended. Bowel sounds active throughout. There are no masses palpable. No hepatosplenomegaly. Extremities: no clubbing, cyanosis, or edema Neurological: Alert and oriented to person place and time. Skin: Skin is warm and dry.  Psychiatric: Normal mood and affect. Behavior is normal.  RELEVANT LABS AND IMAGING: CBC    Component Value Date/Time   WBC 5.9 12/23/2015 1424   RBC 4.46 12/23/2015 1424   HGB 13.0 12/23/2015 1424   HCT 40.9 12/23/2015 1424   PLT 200 12/23/2015 1424   MCV 91.7 12/23/2015 1424   MCH 29.1 12/23/2015 1424   MCHC 31.8 12/23/2015 1424   RDW 14.3 12/23/2015 1424    CMP     Component Value Date/Time   NA 141 12/23/2015 1424   K 3.9 12/23/2015 1424   CL 109 12/23/2015 1424   CO2 25 12/23/2015 1424   GLUCOSE 79  12/23/2015 1424   BUN 16 12/23/2015 1424   CREATININE 1.03 (H) 12/23/2015 1424   CALCIUM 9.3 12/23/2015 1424   GFRNONAA 58 (L) 12/23/2015 1424   GFRAA >60 12/23/2015 1424    ASSESSMENT/PLAN: 61 year old female with past medical history of GERD, chronic constipation, hyperlipidemia, hypothyroidism, anxiety and depression who is seen in consultation at the request of Dr. Kenton Kingfisher to evaluate chronic constipation and also consider colon cancer screening.  1. Chronic constipation -- I would like her to retry Linzess as she is not certain why it did not work. She's also uncertain of which dose she tried. We'll try Linzess 290 g daily. If after 2 weeks this is ineffective she is asked to notify me and we will try her on Trulance.  Previously intolerant of Amitiza due to nausea. I suspect that the looser stool that she is noted in the last 3 weeks is related to high-dose vitamin D therapy.  2. Colon cancer screening -- 1 second-degree relative with colon cancer,  no first degree relatives.  Her prep was "less than ideal" in 2012 and I have recommended repeating colonoscopy for this reason at this time. We discussed the risks, benefits and alternatives to screening colonoscopy and she is agreeable and wishes to proceed. Plan 2 day bowel prep.  3. GERD -- stable on Nexium without alarm symptoms. Risk versus benefit of chronic PPI reviewed. She will continue Nexium 40 mg daily. She was seen by nephrology recently who did not advise stopping PPI.    SV:[id number], [place], [place] [address]

## 2016-12-03 ENCOUNTER — Encounter: Payer: Self-pay | Admitting: Internal Medicine

## 2016-12-12 HISTORY — PX: OTHER SURGICAL HISTORY: SHX169

## 2016-12-17 DIAGNOSIS — Z85828 Personal history of other malignant neoplasm of skin: Secondary | ICD-10-CM | POA: Insufficient documentation

## 2016-12-25 ENCOUNTER — Encounter: Payer: Self-pay | Admitting: Internal Medicine

## 2016-12-28 ENCOUNTER — Encounter: Payer: BLUE CROSS/BLUE SHIELD | Admitting: Internal Medicine

## 2016-12-31 ENCOUNTER — Encounter: Payer: BLUE CROSS/BLUE SHIELD | Admitting: Internal Medicine

## 2017-01-05 ENCOUNTER — Other Ambulatory Visit: Payer: Self-pay

## 2017-01-05 ENCOUNTER — Telehealth: Payer: Self-pay | Admitting: Internal Medicine

## 2017-01-05 DIAGNOSIS — R110 Nausea: Secondary | ICD-10-CM

## 2017-01-05 MED ORDER — METOCLOPRAMIDE HCL 10 MG PO TABS: 10.0000 mg | ORAL_TABLET | Freq: Four times a day (QID) | ORAL | 0 refills | Status: DC | PRN

## 2017-01-05 NOTE — Telephone Encounter (Signed)
Returned patients call regarding nausea. Spoke with Dr. Hilarie Fredrickson and he prescribed  Reglan 10 mg sent to her pharmacy. Patient verbalizes understanding for taking reglan and completing her prep.   Riki Sheer, LPN

## 2017-01-06 ENCOUNTER — Encounter: Payer: Self-pay | Admitting: Internal Medicine

## 2017-01-06 ENCOUNTER — Ambulatory Visit (AMBULATORY_SURGERY_CENTER): Payer: BLUE CROSS/BLUE SHIELD | Admitting: Internal Medicine

## 2017-01-06 VITALS — BP 119/77 | HR 62 | Temp 97.7°F | Resp 10 | Ht 66.0 in | Wt 187.0 lb

## 2017-01-06 DIAGNOSIS — Z1211 Encounter for screening for malignant neoplasm of colon: Secondary | ICD-10-CM | POA: Diagnosis present

## 2017-01-06 DIAGNOSIS — Z1212 Encounter for screening for malignant neoplasm of rectum: Secondary | ICD-10-CM | POA: Diagnosis not present

## 2017-01-06 MED ORDER — SODIUM CHLORIDE 0.9 % IV SOLN: 500.0000 mL | INTRAVENOUS | Status: DC

## 2017-01-06 NOTE — Patient Instructions (Signed)
Discharge instructions given. Handout on hemorrhoids. Resume previous medications. Samples of Trulance given in Recovery room by Dr. Raquel James. YOU HAD AN ENDOSCOPIC PROCEDURE TODAY AT [facility]:   Refer to the procedure report that was given to you for any specific questions about what was found during the examination.  If the procedure report does not answer your questions, please call your gastroenterologist to clarify.  If you requested that your care partner not be given the details of your procedure findings, then the procedure report has been included in a sealed envelope for you to review at your convenience later.  YOU SHOULD EXPECT: Some feelings of bloating in the abdomen. Passage of more gas than usual.  Walking can help get rid of the air that was put into your GI tract during the procedure and reduce the bloating. If you had a lower endoscopy (such as a colonoscopy or flexible sigmoidoscopy) you may notice spotting of blood in your stool or on the toilet paper. If you underwent a bowel prep for your procedure, you may not have a normal bowel movement for a few days.  Please Note:  You might notice some irritation and congestion in your nose or some drainage.  This is from the oxygen used during your procedure.  There is no need for concern and it should clear up in a day or so.  SYMPTOMS TO REPORT IMMEDIATELY:   Following lower endoscopy (colonoscopy or flexible sigmoidoscopy):  Excessive amounts of blood in the stool  Significant tenderness or worsening of abdominal pains  Swelling of the abdomen that is new, acute  Fever of 100F or higher   For urgent or emergent issues, a gastroenterologist can be reached at any hour by calling [phone].   DIET:  We do recommend a small meal at first, but then you may proceed to your regular diet.  Drink plenty of fluids but you should avoid alcoholic beverages for 24 hours.  ACTIVITY:  You should plan to take it easy  for the rest of today and you should NOT DRIVE or use heavy machinery until tomorrow (because of the sedation medicines used during the test).    FOLLOW UP: Our staff will call the number listed on your records the next business day following your procedure to check on you and address any questions or concerns that you may have regarding the information given to you following your procedure. If we do not reach you, we will leave a message.  However, if you are feeling well and you are not experiencing any problems, there is no need to return our call.  We will assume that you have returned to your regular daily activities without incident.  If any biopsies were taken you will be contacted by phone or by letter within the next 1-3 weeks.  Please call us at [phone] if you have not heard about the biopsies in 3 weeks.    SIGNATURES/CONFIDENTIALITY: You and/or your care partner have signed paperwork which will be entered into your electronic medical record.  These signatures attest to the fact that that the information above on your After Visit Summary has been reviewed and is understood.  Full responsibility of the confidentiality of this discharge information lies with you and/or your care-partner.

## 2017-01-06 NOTE — Op Note (Signed)
[facility] Patient Name: Jessica [name] Procedure Date: 01/06/2017 3:05 PM MRN: [id number] Endoscopist: Jerene Bears , MD Age: 61 Referring MD:  Date of Birth: Nov 02, 1955 Gender: Female Account #: [id number] Procedure:                Colonoscopy Indications:              Screening for colorectal malignant neoplasm Medicines:                Monitored Anesthesia Care Procedure:                Pre-Anesthesia Assessment:                           - Prior to the procedure, a History and Physical                            was performed, and patient medications and                            allergies were reviewed. The patient's tolerance of                            previous anesthesia was also reviewed. The risks                            and benefits of the procedure and the sedation                            options and risks were discussed with the patient.                            All questions were answered, and informed consent                            was obtained. Prior Anticoagulants: The patient has                            taken no previous anticoagulant or antiplatelet                            agents. ASA Grade Assessment: II - A patient with                            mild systemic disease. After reviewing the risks                            and benefits, the patient was deemed in                            satisfactory condition to undergo the procedure.                           After obtaining informed consent, the colonoscope  was passed under direct vision. Throughout the                            procedure, the patient's blood pressure, pulse, and                            oxygen saturations were monitored continuously. The                            Colonoscope was introduced through the anus and                            advanced to the the cecum, identified by                            appendiceal orifice and  ileocecal valve. The                            colonoscopy was performed without difficulty. The                            patient tolerated the procedure well. The quality                            of the bowel preparation was excellent. The                            ileocecal valve, appendiceal orifice, and rectum                            were photographed. The bowel preparation used was 2                            day MiraLax + Suprep with metoclopramide before                            each half of prep. Scope In: 3:12:31 PM Scope Out: 3:27:58 PM Scope Withdrawal Time: 0 hours 9 minutes 45 seconds  Total Procedure Duration: 0 hours 15 minutes 27 seconds  Findings:                 The digital rectal exam was normal.                           The entire examined colon appeared normal.                           Internal hemorrhoids were found during                            retroflexion. The hemorrhoids were small. Complications:            No immediate complications. Estimated Blood Loss:     Estimated blood loss: none. Impression:               - The entire  examined colon is normal.                           - Small internal hemorrhoids.                           - No specimens collected. Recommendation:           - Patient has a contact number available for                            emergencies. The signs and symptoms of potential                            delayed complications were discussed with the                            patient. Return to normal activities tomorrow.                            Written discharge instructions were provided to the                            patient.                           - Resume previous diet.                           - Continue present medications. Stop Linzess                            (ineffective) and begin Trulance 3 mg daily.                            Continue high fiber diet with liberal fluid intake.                            - Repeat colonoscopy in 10 years for screening                            purposes. Jerene Bears, MD 01/06/2017 3:34:32 PM This report has been signed electronically.

## 2017-01-06 NOTE — Progress Notes (Signed)
Report to PACU, RN, vss, BBS= Clear.  

## 2017-01-07 ENCOUNTER — Telehealth: Payer: Self-pay | Admitting: *Deleted

## 2017-01-07 NOTE — Telephone Encounter (Signed)
  Follow up Call-  Call back number 01/06/2017  Post procedure Call Back phone  # [phone]  Permission to leave phone message Yes  Some recent data might be hidden     Patient questions:  Do you have a fever, pain , or abdominal swelling? No. Pain Score  0 *  Have you tolerated food without any problems? Yes.    Have you been able to return to your normal activities? Yes.    Do you have any questions about your discharge instructions: Diet   No. Medications  No. Follow up visit  No.  Do you have questions or concerns about your Care? No.  Actions: * If pain score is 4 or above: No action needed, pain <4.

## 2017-05-26 ENCOUNTER — Other Ambulatory Visit: Payer: Self-pay | Admitting: Specialist

## 2017-05-26 DIAGNOSIS — R928 Other abnormal and inconclusive findings on diagnostic imaging of breast: Secondary | ICD-10-CM

## 2017-06-01 ENCOUNTER — Ambulatory Visit
Admission: RE | Admit: 2017-06-01 | Discharge: 2017-06-01 | Disposition: A | Discharge status: Home / Self Care | Payer: BLUE CROSS/BLUE SHIELD | Source: Ambulatory Visit | Attending: Specialist | Admitting: Specialist

## 2017-06-01 DIAGNOSIS — R928 Other abnormal and inconclusive findings on diagnostic imaging of breast: Secondary | ICD-10-CM

## 2017-06-29 ENCOUNTER — Other Ambulatory Visit: Payer: Self-pay | Admitting: Surgery

## 2017-07-21 ENCOUNTER — Encounter (HOSPITAL_BASED_OUTPATIENT_CLINIC_OR_DEPARTMENT_OTHER): Payer: Self-pay | Admitting: *Deleted

## 2017-07-21 ENCOUNTER — Other Ambulatory Visit: Payer: Self-pay

## 2017-07-22 NOTE — Progress Notes (Signed)
Boost drink given with instructions. Pt verbalized understanding to be completed by 0415 am of surgery and NPO otherwise.

## 2017-07-28 NOTE — H&P (Signed)
Jessica [name] Documented: 06/29/2017 2:57 PM Location: [facility] Patient #: [id number] DOB: 18-Mar-1956 Married / Language: English / Race: White Female   History of Present Illness (Scorpio Fortin A. Ninfa Linden MD; 06/29/2017 3:21 PM) The patient is a 62 year old female who presents with a breast mass. This patient is referred by Dr. Jamey Reas for evaluation of a right breast mass. The patient reports noticing the mass in the right breast around December. She started having discomfort at the site of the mass. She denies trauma to the area. She had mammograms and ultrasound showing the mass. She underwent a stereotactic biopsy which suggested fat necrosis. Surgical excision has been recommended for complete histologic evaluation. She has had no previous problems with her breasts. She denies trauma to the area. She has no history of breast cancer. She has no nipple discharge. She does have a history of prior breast reduction. She is otherwise without complaints. Again, the mass causes discomfort.   Past Surgical History (Tanisha A. Owens Shark, [place]; 06/29/2017 2:57 PM) Breast Biopsy  Right. Foot Surgery  Right. Mammoplasty; Reduction  Bilateral. Tonsillectomy   Diagnostic Studies History (Tanisha A. Owens Shark, [place]; 06/29/2017 2:57 PM) Colonoscopy  within last year Mammogram  within last year Pap Smear  1-5 years ago  Allergies (Tanisha A. Owens Shark, [place]; 06/29/2017 2:59 PM) No Known Drug Allergies [06/29/2017]: Allergies Reconciled   Medication History (Tanisha A. Owens Shark, [place]; 06/29/2017 3:00 PM) ALPRAZolam (0.25MG  Tablet, Oral) Active. Atorvastatin Calcium (40MG  Tablet, Oral) Active. BuPROPion HCl ER (XL) (150MG  Tablet ER 24HR, Oral) Active. Clindamycin Phos-Benzoyl Perox (1.2-5% Gel, External) Active. Cyclobenzaprine HCl (10MG  Tablet, Oral) Active. Ibuprofen (800MG  Tablet, Oral) Active. Meclizine HCl (12.5MG  Tablet, Oral) Active. Methocarbamol (500MG  Tablet,  Oral) Active. Nabumetone (750MG  Tablet, Oral) Active. Polymyxin B-Trimethoprim (10000-0.1UNIT/ML-% Solution, Ophthalmic) Active. Pravastatin Sodium (20MG  Tablet, Oral) Active. Scopolamine (1MG /3DAYS Patch 72HR, Transdermal) Active. Sertraline HCl (100MG  Tablet, Oral) Active. TiZANidine HCl (4MG  Capsule, Oral) Active. Medications Reconciled  Social History (Tanisha A. Owens Shark, [place]; 06/29/2017 2:57 PM) Alcohol use  Occasional alcohol use. Caffeine use  Carbonated beverages, Coffee, Tea. No drug use  Tobacco use  Never smoker.  Family History (Tanisha A. Owens Shark, [place]; 06/29/2017 2:57 PM) Arthritis  Mother. Cancer  Mother. Cerebrovascular Accident  Mother.  Pregnancy / Birth History (Tanisha A. Owens Shark, [place]; 06/29/2017 2:57 PM) Age at menarche  29 years. Age of menopause  >82 Gravida  3 Irregular periods  Length (months) of breastfeeding  3-6 Maternal age  58-30 Para  3  Other Problems (Tanisha A. Owens Shark, [place]; 06/29/2017 2:57 PM) Anxiety Disorder  Back Pain  Gastroesophageal Reflux Disease  Hypercholesterolemia  Lump In Breast  Thyroid Disease     Review of Systems (Tanisha A. Brown RMA; 06/29/2017 2:57 PM) General Not Present- Appetite Loss, Chills, Fatigue, Fever, Night Sweats, Weight Gain and Weight Loss. Skin Present- Dryness. Not Present- Change in Wart/Mole, Hives, Jaundice, New Lesions, Non-Healing Wounds, Rash and Ulcer. HEENT Present- Seasonal Allergies and Wears glasses/contact lenses. Not Present- Earache, Hearing Loss, Hoarseness, Nose Bleed, Oral Ulcers, Ringing in the Ears, Sinus Pain, Sore Throat, Visual Disturbances and Yellow Eyes. Breast Present- Breast Mass and Breast Pain. Not Present- Nipple Discharge and Skin Changes. Cardiovascular Not Present- Chest Pain, Difficulty Breathing Lying Down, Leg Cramps, Palpitations, Rapid Heart Rate, Shortness of Breath and Swelling of Extremities. Gastrointestinal Not Present- Abdominal Pain, Bloating,  Bloody Stool, Change in Bowel Habits, Chronic diarrhea, Constipation, Difficulty Swallowing, Excessive gas, Gets full quickly at meals, Hemorrhoids, Indigestion, Nausea, Rectal Pain and  Vomiting. Female Genitourinary Not Present- Frequency, Nocturia, Painful Urination, Pelvic Pain and Urgency. Musculoskeletal Present- Back Pain and Muscle Weakness. Not Present- Joint Pain, Joint Stiffness, Muscle Pain and Swelling of Extremities. Psychiatric Present- Anxiety. Not Present- Bipolar, Change in Sleep Pattern, Depression, Fearful and Frequent crying. Endocrine Present- Hair Changes and Hot flashes. Not Present- Cold Intolerance, Excessive Hunger, Heat Intolerance and New Diabetes. Hematology Not Present- Blood Thinners, Easy Bruising, Excessive bleeding, Gland problems, HIV and Persistent Infections.  Vitals (Tanisha A. Brown RMA; 06/29/2017 2:59 PM) 06/29/2017 2:58 PM Weight: 191.4 lb Height: 66in Body Surface Area: 1.96 m Body Mass Index: 30.89 kg/m  Temp.: 71F  Pulse: 81 (Regular)  BP: 122/78 (Sitting, Left Arm, Standard)       Physical Exam (Kaaren Nass A. Ninfa Linden MD; 06/29/2017 3:22 PM) The physical exam findings are as follows: Note:On exam, she is well appearance Lungs are clear bilaterally Cardiovascular is regular rate and rhythm Respiratory examination showed bilateral reduction. There is a hard, firm mass in the upper inner quadrant of the right breast several centimeters from the areola. There are no skin changes. There is no axillary adenopathy. The mass feels to be 1-1/2-2 cm in size.    Assessment & Plan (Sequoia Mincey A. Ninfa Linden MD; 06/29/2017 3:23 PM) BREAST MASS, RIGHT (N63.10) Impression: I reviewed her films and pathology. We have discussed the mass in detail. Because of the size of the mass and discomfort she is having and for histologic purposes to rule out malignancy, right breast lumpectomy is strongly recommended. I discussed this with her in detail. We  discussed the surgical procedure. I discussed the risk which include but not limited to bleeding, infection, injury to surrounding structures, the need for further surgery if malignancy is found, etc. Since I feel the mass well, I will hold on having a radioactive seed placed. She understands and wishes to proceed with surgery which will be scheduled

## 2017-07-29 ENCOUNTER — Ambulatory Visit (HOSPITAL_BASED_OUTPATIENT_CLINIC_OR_DEPARTMENT_OTHER)
Admission: RE | Admit: 2017-07-29 | Discharge: 2017-07-29 | Disposition: A | Discharge status: Home / Self Care | Payer: BLUE CROSS/BLUE SHIELD | Source: Ambulatory Visit | Attending: Surgery | Admitting: Surgery

## 2017-07-29 ENCOUNTER — Encounter (HOSPITAL_BASED_OUTPATIENT_CLINIC_OR_DEPARTMENT_OTHER)
Admission: RE | Disposition: A | Discharge status: Home / Self Care | Payer: Self-pay | Source: Ambulatory Visit | Attending: Surgery

## 2017-07-29 ENCOUNTER — Other Ambulatory Visit: Payer: Self-pay

## 2017-07-29 ENCOUNTER — Ambulatory Visit (HOSPITAL_BASED_OUTPATIENT_CLINIC_OR_DEPARTMENT_OTHER): Payer: BLUE CROSS/BLUE SHIELD | Admitting: Certified Registered Nurse Anesthetist

## 2017-07-29 ENCOUNTER — Encounter (HOSPITAL_BASED_OUTPATIENT_CLINIC_OR_DEPARTMENT_OTHER): Payer: Self-pay | Admitting: *Deleted

## 2017-07-29 DIAGNOSIS — N641 Fat necrosis of breast: Secondary | ICD-10-CM | POA: Insufficient documentation

## 2017-07-29 DIAGNOSIS — E7800 Pure hypercholesterolemia, unspecified: Secondary | ICD-10-CM | POA: Insufficient documentation

## 2017-07-29 DIAGNOSIS — Z79899 Other long term (current) drug therapy: Secondary | ICD-10-CM | POA: Insufficient documentation

## 2017-07-29 DIAGNOSIS — N6310 Unspecified lump in the right breast, unspecified quadrant: Secondary | ICD-10-CM | POA: Diagnosis present

## 2017-07-29 HISTORY — DX: Unspecified lump in the right breast, unspecified quadrant: N63.10

## 2017-07-29 HISTORY — DX: Dizziness and giddiness: R42

## 2017-07-29 HISTORY — PX: BREAST LUMPECTOMY: SHX2

## 2017-07-29 SURGERY — BREAST LUMPECTOMY
Anesthesia: General | Site: Breast | Laterality: Right

## 2017-07-29 MED ORDER — MIDAZOLAM HCL 5 MG/5ML IJ SOLN
INTRAMUSCULAR | Status: DC | PRN
  Administered 2017-07-29: 2 mg via INTRAVENOUS

## 2017-07-29 MED ORDER — DEXAMETHASONE SODIUM PHOSPHATE 10 MG/ML IJ SOLN
INTRAMUSCULAR | Status: DC | PRN
  Administered 2017-07-29: 10 mg via INTRAVENOUS

## 2017-07-29 MED ORDER — PROPOFOL 10 MG/ML IV BOLUS
INTRAVENOUS | Status: AC
  Filled 2017-07-29: qty 40

## 2017-07-29 MED ORDER — OXYCODONE HCL 5 MG/5ML PO SOLN: 5.0000 mg | Freq: Once | ORAL | Status: DC | PRN

## 2017-07-29 MED ORDER — TRAMADOL HCL 50 MG PO TABS: 50.0000 mg | ORAL_TABLET | Freq: Four times a day (QID) | ORAL | 1 refills | Status: DC | PRN

## 2017-07-29 MED ORDER — LIDOCAINE 2% (20 MG/ML) 5 ML SYRINGE
INTRAMUSCULAR | Status: DC | PRN
  Administered 2017-07-29: 60 mg via INTRAVENOUS

## 2017-07-29 MED ORDER — DEXAMETHASONE SODIUM PHOSPHATE 10 MG/ML IJ SOLN
INTRAMUSCULAR | Status: AC
  Filled 2017-07-29: qty 1

## 2017-07-29 MED ORDER — BUPIVACAINE-EPINEPHRINE 0.5% -1:200000 IJ SOLN
INTRAMUSCULAR | Status: DC | PRN
  Administered 2017-07-29: 10 mL

## 2017-07-29 MED ORDER — FENTANYL CITRATE (PF) 100 MCG/2ML IJ SOLN
INTRAMUSCULAR | Status: AC
  Filled 2017-07-29: qty 2

## 2017-07-29 MED ORDER — ACETAMINOPHEN 500 MG PO TABS
1000.0000 mg | ORAL_TABLET | ORAL | Status: AC
  Administered 2017-07-29: 1000 mg via ORAL

## 2017-07-29 MED ORDER — HYDROMORPHONE HCL 1 MG/ML IJ SOLN: 0.2500 mg | INTRAMUSCULAR | Status: DC | PRN

## 2017-07-29 MED ORDER — CELECOXIB 200 MG PO CAPS
ORAL_CAPSULE | ORAL | Status: AC
  Filled 2017-07-29: qty 1

## 2017-07-29 MED ORDER — CELECOXIB 200 MG PO CAPS
200.0000 mg | ORAL_CAPSULE | ORAL | Status: AC
  Administered 2017-07-29: 200 mg via ORAL

## 2017-07-29 MED ORDER — CHLORHEXIDINE GLUCONATE CLOTH 2 % EX PADS: 6.0000 | MEDICATED_PAD | Freq: Once | CUTANEOUS | Status: DC

## 2017-07-29 MED ORDER — SCOPOLAMINE 1 MG/3DAYS TD PT72: 1.0000 | MEDICATED_PATCH | Freq: Once | TRANSDERMAL | Status: DC | PRN

## 2017-07-29 MED ORDER — GABAPENTIN 300 MG PO CAPS
ORAL_CAPSULE | ORAL | Status: AC
  Filled 2017-07-29: qty 1

## 2017-07-29 MED ORDER — BUPIVACAINE-EPINEPHRINE (PF) 0.5% -1:200000 IJ SOLN
INTRAMUSCULAR | Status: AC
  Filled 2017-07-29: qty 30

## 2017-07-29 MED ORDER — PROPOFOL 10 MG/ML IV BOLUS
INTRAVENOUS | Status: DC | PRN
  Administered 2017-07-29: 200 mg via INTRAVENOUS

## 2017-07-29 MED ORDER — MIDAZOLAM HCL 2 MG/2ML IJ SOLN
INTRAMUSCULAR | Status: AC
  Filled 2017-07-29: qty 2

## 2017-07-29 MED ORDER — LIDOCAINE HCL (CARDIAC) PF 100 MG/5ML IV SOSY
PREFILLED_SYRINGE | INTRAVENOUS | Status: AC
  Filled 2017-07-29: qty 5

## 2017-07-29 MED ORDER — CEFAZOLIN SODIUM-DEXTROSE 2-4 GM/100ML-% IV SOLN
INTRAVENOUS | Status: AC
  Filled 2017-07-29: qty 100

## 2017-07-29 MED ORDER — LACTATED RINGERS IV SOLN
INTRAVENOUS | Status: DC
  Administered 2017-07-29: 07:00:00 via INTRAVENOUS

## 2017-07-29 MED ORDER — ONDANSETRON HCL 4 MG/2ML IJ SOLN
INTRAMUSCULAR | Status: DC | PRN
  Administered 2017-07-29: 4 mg via INTRAVENOUS

## 2017-07-29 MED ORDER — ONDANSETRON HCL 4 MG/2ML IJ SOLN: 4.0000 mg | Freq: Once | INTRAMUSCULAR | Status: DC | PRN

## 2017-07-29 MED ORDER — CEFAZOLIN SODIUM-DEXTROSE 2-4 GM/100ML-% IV SOLN
2.0000 g | INTRAVENOUS | Status: AC
  Administered 2017-07-29: 2 g via INTRAVENOUS

## 2017-07-29 MED ORDER — OXYCODONE HCL 5 MG PO TABS: 5.0000 mg | ORAL_TABLET | Freq: Once | ORAL | Status: DC | PRN

## 2017-07-29 MED ORDER — GABAPENTIN 300 MG PO CAPS
300.0000 mg | ORAL_CAPSULE | ORAL | Status: AC
  Administered 2017-07-29: 300 mg via ORAL

## 2017-07-29 MED ORDER — FENTANYL CITRATE (PF) 100 MCG/2ML IJ SOLN
INTRAMUSCULAR | Status: DC | PRN
  Administered 2017-07-29 (×2): 25 ug via INTRAVENOUS
  Administered 2017-07-29: 50 ug via INTRAVENOUS

## 2017-07-29 MED ORDER — ONDANSETRON HCL 4 MG/2ML IJ SOLN
INTRAMUSCULAR | Status: AC
  Filled 2017-07-29: qty 2

## 2017-07-29 MED ORDER — ACETAMINOPHEN 500 MG PO TABS
ORAL_TABLET | ORAL | Status: AC
  Filled 2017-07-29: qty 2

## 2017-07-29 MED ORDER — MIDAZOLAM HCL 2 MG/2ML IJ SOLN: 1.0000 mg | INTRAMUSCULAR | Status: DC | PRN

## 2017-07-29 MED ORDER — FENTANYL CITRATE (PF) 100 MCG/2ML IJ SOLN: 50.0000 ug | INTRAMUSCULAR | Status: DC | PRN

## 2017-07-29 SURGICAL SUPPLY — 43 items
BLADE HEX COATED 2.75 (ELECTRODE) ×2 IMPLANT
BLADE SURG 15 STRL LF DISP TIS (BLADE) ×1 IMPLANT
BLADE SURG 15 STRL SS (BLADE) ×1
CANISTER SUCT 1200ML W/VALVE (MISCELLANEOUS) IMPLANT
CHLORAPREP W/TINT 26ML (MISCELLANEOUS) ×2 IMPLANT
CLIP VESOCCLUDE SM WIDE 6/CT (CLIP) IMPLANT
COVER BACK TABLE 60X90IN (DRAPES) ×2 IMPLANT
COVER MAYO STAND STRL (DRAPES) ×2 IMPLANT
DECANTER SPIKE VIAL GLASS SM (MISCELLANEOUS) IMPLANT
DERMABOND ADVANCED (GAUZE/BANDAGES/DRESSINGS) ×1
DERMABOND ADVANCED .7 DNX12 (GAUZE/BANDAGES/DRESSINGS) ×1 IMPLANT
DEVICE DUBIN W/COMP PLATE 8390 (MISCELLANEOUS) IMPLANT
DRAPE LAPAROTOMY 100X72 PEDS (DRAPES) ×2 IMPLANT
DRAPE UTILITY XL STRL (DRAPES) ×2 IMPLANT
ELECT REM PT RETURN 9FT ADLT (ELECTROSURGICAL) ×2
ELECTRODE REM PT RTRN 9FT ADLT (ELECTROSURGICAL) ×1 IMPLANT
GAUZE SPONGE 4X4 12PLY STRL LF (GAUZE/BANDAGES/DRESSINGS) ×2 IMPLANT
GLOVE BIO SURGEON STRL SZ 6.5 (GLOVE) ×2 IMPLANT
GLOVE BIOGEL PI IND STRL 6.5 (GLOVE) ×1 IMPLANT
GLOVE BIOGEL PI IND STRL 7.0 (GLOVE) ×1 IMPLANT
GLOVE BIOGEL PI INDICATOR 6.5 (GLOVE) ×1
GLOVE BIOGEL PI INDICATOR 7.0 (GLOVE) ×1
GLOVE SURG SIGNA 7.5 PF LTX (GLOVE) ×2 IMPLANT
GOWN STRL REUS W/ TWL LRG LVL3 (GOWN DISPOSABLE) ×1 IMPLANT
GOWN STRL REUS W/ TWL XL LVL3 (GOWN DISPOSABLE) ×1 IMPLANT
GOWN STRL REUS W/TWL LRG LVL3 (GOWN DISPOSABLE) ×1
GOWN STRL REUS W/TWL XL LVL3 (GOWN DISPOSABLE) ×1
KIT MARKER MARGIN INK (KITS) IMPLANT
NEEDLE HYPO 25X1 1.5 SAFETY (NEEDLE) ×2 IMPLANT
NS IRRIG 1000ML POUR BTL (IV SOLUTION) ×2 IMPLANT
PACK BASIN DAY SURGERY FS (CUSTOM PROCEDURE TRAY) ×2 IMPLANT
PENCIL BUTTON HOLSTER BLD 10FT (ELECTRODE) ×2 IMPLANT
SLEEVE SCD COMPRESS KNEE MED (MISCELLANEOUS) ×2 IMPLANT
SPONGE LAP 4X18 X RAY DECT (DISPOSABLE) ×2 IMPLANT
SUT MNCRL AB 4-0 PS2 18 (SUTURE) ×2 IMPLANT
SUT SILK 2 0 SH (SUTURE) ×2 IMPLANT
SUT VIC AB 3-0 SH 27 (SUTURE) ×1
SUT VIC AB 3-0 SH 27X BRD (SUTURE) ×1 IMPLANT
SYR CONTROL 10ML LL (SYRINGE) ×2 IMPLANT
TOWEL OR 17X24 6PK STRL BLUE (TOWEL DISPOSABLE) ×2 IMPLANT
TOWEL OR NON WOVEN STRL DISP B (DISPOSABLE) ×2 IMPLANT
TUBE CONNECTING 20X1/4 (TUBING) IMPLANT
YANKAUER SUCT BULB TIP NO VENT (SUCTIONS) IMPLANT

## 2017-07-29 NOTE — Interval H&P Note (Signed)
History and Physical Interval Note:no change in H and P  07/29/2017 7:05 AM  Jessica [name]  has presented today for surgery, with the diagnosis of RIGHT BREAST MASS  The various methods of treatment have been discussed with the patient and family. After consideration of risks, benefits and other options for treatment, the patient has consented to  Procedure(s): RIGHT BREAST LUMPECTOMY (Right) as a surgical intervention .  The patient's history has been reviewed, patient examined, no change in status, stable for surgery.  I have reviewed the patient's chart and labs.  Questions were answered to the patient's satisfaction.     [name]

## 2017-07-29 NOTE — Discharge Instructions (Signed)
[facility] °Office Phone Number [phone] ° °BREAST BIOPSY/ PARTIAL MASTECTOMY: POST OP INSTRUCTIONS ° °Always review your discharge instruction sheet given to you by the facility where your surgery was performed. ° °IF YOU HAVE DISABILITY OR FAMILY LEAVE FORMS, YOU MUST BRING THEM TO THE OFFICE FOR PROCESSING.  DO NOT GIVE THEM TO YOUR DOCTOR. ° °1. A prescription for pain medication may be given to you upon discharge.  Take your pain medication as prescribed, if needed.  If narcotic pain medicine is not needed, then you may take acetaminophen (Tylenol) or ibuprofen (Advil) as needed. °2. Take your usually prescribed medications unless otherwise directed °3. If you need a refill on your pain medication, please contact your pharmacy.  They will contact our office to request authorization.  Prescriptions will not be filled after 5pm or on week-ends. °4. You should eat very light the first 24 hours after surgery, such as soup, crackers, pudding, etc.  Resume your normal diet the day after surgery. °5. Most patients will experience some swelling and bruising in the breast.  Ice packs and a good support bra will help.  Swelling and bruising can take several days to resolve.  °6. It is common to experience some constipation if taking pain medication after surgery.  Increasing fluid intake and taking a stool softener will usually help or prevent this problem from occurring.  A mild laxative (Milk of Magnesia or Miralax) should be taken according to package directions if there are no bowel movements after 48 hours. °7. Unless discharge instructions indicate otherwise, you may remove your bandages 24-48 hours after surgery, and you may shower at that time.  You may have steri-strips (small skin tapes) in place directly over the incision.  These strips should be left on the skin for 7-10 days.  If your surgeon used skin glue on the incision, you may shower in 24 hours.  The glue will flake off over the  next 2-3 weeks.  Any sutures or staples will be removed at the office during your follow-up visit. °8. ACTIVITIES:  You may resume regular daily activities (gradually increasing) beginning the next day.  Wearing a good support bra or sports bra minimizes pain and swelling.  You may have sexual intercourse when it is comfortable. °a. You may drive when you no longer are taking prescription pain medication, you can comfortably wear a seatbelt, and you can safely maneuver your car and apply brakes. °b. RETURN TO WORK:  ______________________________________________________________________________________ °9. You should see your doctor in the office for a follow-up appointment approximately two weeks after your surgery.  Your doctor’s nurse will typically make your follow-up appointment when she calls you with your pathology report.  Expect your pathology report 2-3 business days after your surgery.  You may call to check if you do not hear from us after three days. °10. OTHER INSTRUCTIONS: _OK TO SHOWER STARTING TOMORROW °11. ICE PACK, TYLENOL, IBUPROFEN ALSO FOR PAIN °12. ______________________________________________________________________________________________ _____________________________________________________________________________________________________________________________________ °_____________________________________________________________________________________________________________________________________ °_____________________________________________________________________________________________________________________________________ ° °WHEN TO CALL YOUR DOCTOR: °1. Fever over 101.0 °2. Nausea and/or vomiting. °3. Extreme swelling or bruising. °4. Continued bleeding from incision. °5. Increased pain, redness, or drainage from the incision. ° °The clinic staff is available to answer your questions during regular business hours.  Please don’t hesitate to call and ask to speak to one of the  nurses for clinical concerns.  If you have a medical emergency, go to the nearest emergency room or call 911.  A surgeon from [facility] is   always on call at the hospital. ° °For further questions, please visit [email or web address]  ° ° °Post Anesthesia Home Care Instructions ° °Activity: °Get plenty of rest for the remainder of the day. A responsible individual must stay with you for 24 hours following the procedure.  °For the next 24 hours, DO NOT: °-Drive a car °-Operate machinery °-Drink alcoholic beverages °-Take any medication unless instructed by your physician °-Make any legal decisions or sign important papers. ° °Meals: °Start with liquid foods such as gelatin or soup. Progress to regular foods as tolerated. Avoid greasy, spicy, heavy foods. If nausea and/or vomiting occur, drink only clear liquids until the nausea and/or vomiting subsides. Call your physician if vomiting continues. ° °Special Instructions/Symptoms: °Your throat may feel dry or sore from the anesthesia or the breathing tube placed in your throat during surgery. If this causes discomfort, gargle with warm salt water. The discomfort should disappear within 24 hours. ° °If you had a scopolamine patch placed behind your ear for the management of post- operative nausea and/or vomiting: ° °1. The medication in the patch is effective for 72 hours, after which it should be removed.  Wrap patch in a tissue and discard in the trash. Wash hands thoroughly with soap and water. °2. You may remove the patch earlier than 72 hours if you experience unpleasant side effects which may include dry mouth, dizziness or visual disturbances. °3. Avoid touching the patch. Wash your hands with soap and water after contact with the patch. °   ° °

## 2017-07-29 NOTE — Anesthesia Postprocedure Evaluation (Signed)
Anesthesia Post Note  Patient: [name]  Procedure(s) Performed: RIGHT BREAST LUMPECTOMY (Right Breast)     Patient location during evaluation: PACU Anesthesia Type: General Level of consciousness: awake and alert Pain management: pain level controlled Vital Signs Assessment: post-procedure vital signs reviewed and stable Respiratory status: spontaneous breathing, nonlabored ventilation, respiratory function stable and patient connected to nasal cannula oxygen Cardiovascular status: blood pressure returned to baseline and stable Postop Assessment: no apparent nausea or vomiting Anesthetic complications: no    Last Vitals:  Vitals:   07/29/17 0830 07/29/17 0850  BP: 114/66 114/61  Pulse: 60 60  Resp: 13 16  Temp:  36.9 C  SpO2: 98% 100%    Last Pain:  Vitals:   07/29/17 0850  TempSrc:   PainSc: 2                  Nihaal Friesen COKER

## 2017-07-29 NOTE — Anesthesia Procedure Notes (Signed)
Procedure Name: LMA Insertion Date/Time: 07/29/2017 7:32 AM Performed by: Genelle Bal, CRNA Pre-anesthesia Checklist: Patient identified, Emergency Drugs available, Suction available and Patient being monitored Patient Re-evaluated:Patient Re-evaluated prior to induction Oxygen Delivery Method: Circle system utilized Preoxygenation: Pre-oxygenation with 100% oxygen Induction Type: IV induction Ventilation: Mask ventilation without difficulty LMA: LMA inserted LMA Size: 4.0 Number of attempts: 1 Airway Equipment and Method: Bite block Placement Confirmation: positive ETCO2 Tube secured with: Tape Dental Injury: Teeth and Oropharynx as per pre-operative assessment

## 2017-07-29 NOTE — Op Note (Signed)
RIGHT BREAST LUMPECTOMY  Procedure Note  Jessica [name] 07/29/2017   Pre-op Diagnosis: RIGHT BREAST MASS     Post-op Diagnosis: same  Procedure(s): RIGHT BREAST LUMPECTOMY  Surgeon(s): Coralie Keens, MD  Anesthesia: General  Staff:  Circulator: Ted Mcalpine, RN Scrub Person: Nathen May, CST  Estimated Blood Loss: Minimal               Specimens: sent to path  Indications: This is [name] 62 year old female who was found to have [name] right breast mass which was palpable on examination.  She underwent [name] stereotactic biopsy showing fat necrosis.  Because of her symptoms, decision was made to remove the mass at the 2 o'clock position of the right breast  Procedure: The patient was brought to the operating room and identified as the correct patient.  She was placed supine on the operating room table.  We again confirmed the palpable mass at the 2 o'clock position of the right breast.  General anesthesia was then induced.  Her right breast and chest were prepped and draped in the usual sterile fashion.  I anesthetized the edge of the areola with Marcaine.  I made an incision with [name] scalpel.  I then dissected to the 2 o'clock position where I could easily identify the hard palpable mass.  I excised it in its entirety with the cautery and sent to pathology.  It did appear consistent with fat necrosis.  I achieved hemostasis with the cautery.  I then closed the subcutaneous tissue with interrupted 3-0 Vicryl sutures and closed the skin with [name] running 4-0 Monocryl.  Dermabond was then applied.  The patient tolerated procedure well.  All the counts were correct at the end of the procedure.  The patient was then extubated in the operating room and taken in [name] stable condition to the recovery room.          Jessica [name]   Date: 07/29/2017  Time: 8:01 AM

## 2017-07-29 NOTE — Anesthesia Preprocedure Evaluation (Addendum)
Anesthesia Evaluation  Patient identified by MRN, date of birth, ID band Patient awake    Reviewed: Allergy & Precautions, NPO status , Patient's Chart, lab work & pertinent test results  Airway Mallampati: II  TM Distance: >3 FB Neck ROM: Full    Dental  (+) Teeth Intact, Dental Advisory Given   Pulmonary    breath sounds clear to auscultation       Cardiovascular  Rhythm:Regular Rate:Normal     Neuro/Psych    GI/Hepatic   Endo/Other    Renal/GU      Musculoskeletal   Abdominal   Peds  Hematology   Anesthesia Other Findings   Reproductive/Obstetrics                            Anesthesia Physical Anesthesia Plan  ASA: II  Anesthesia Plan: General   Post-op Pain Management:    Induction: Intravenous  PONV Risk Score and Plan: 1 and Ondansetron and Dexamethasone  Airway Management Planned: LMA  Additional Equipment:   Intra-op Plan:   Post-operative Plan:   Informed Consent: I have reviewed the patients History and Physical, chart, labs and discussed the procedure including the risks, benefits and alternatives for the proposed anesthesia with the patient or authorized representative who has indicated his/her understanding and acceptance.   Dental advisory given  Plan Discussed with: CRNA and Anesthesiologist  Anesthesia Plan Comments:         Anesthesia Quick Evaluation

## 2017-07-29 NOTE — Transfer of Care (Signed)
Immediate Anesthesia Transfer of Care Note  Patient: Jessica [name]  Procedure(s) Performed: RIGHT BREAST LUMPECTOMY (Right Breast)  Patient Location: PACU  Anesthesia Type:General  Level of Consciousness: awake, alert  and oriented  Airway & Oxygen Therapy: Patient Spontanous Breathing and Patient connected to face mask oxygen  Post-op Assessment: Report given to RN and Post -op Vital signs reviewed and stable  Post vital signs: Reviewed and stable  Last Vitals:  Vitals Value Taken Time  BP    Temp    Pulse 79 07/29/2017  8:06 AM  Resp 11 07/29/2017  8:06 AM  SpO2 95 % 07/29/2017  8:06 AM  Vitals shown include unvalidated device data.  Last Pain:  Vitals:   07/29/17 0633  TempSrc: Oral  PainSc: 0-No pain      Patients Stated Pain Goal: 0 (45/80/99 [id number])  Complications: No apparent anesthesia complications

## 2017-08-02 ENCOUNTER — Encounter (HOSPITAL_BASED_OUTPATIENT_CLINIC_OR_DEPARTMENT_OTHER): Payer: Self-pay | Admitting: Surgery

## 2017-09-28 ENCOUNTER — Encounter: Payer: Self-pay | Admitting: Neurology

## 2017-09-28 ENCOUNTER — Other Ambulatory Visit: Payer: Self-pay

## 2017-09-28 ENCOUNTER — Ambulatory Visit (INDEPENDENT_AMBULATORY_CARE_PROVIDER_SITE_OTHER): Payer: BLUE CROSS/BLUE SHIELD | Admitting: Neurology

## 2017-09-28 ENCOUNTER — Telehealth: Payer: Self-pay | Admitting: Neurology

## 2017-09-28 VITALS — BP 102/60 | HR 58 | Ht 65.5 in | Wt 186.5 lb

## 2017-09-28 DIAGNOSIS — Z5181 Encounter for therapeutic drug level monitoring: Secondary | ICD-10-CM | POA: Diagnosis not present

## 2017-09-28 DIAGNOSIS — H81399 Other peripheral vertigo, unspecified ear: Secondary | ICD-10-CM | POA: Diagnosis not present

## 2017-09-28 MED ORDER — NORTRIPTYLINE HCL 10 MG PO CAPS: ORAL_CAPSULE | ORAL | 3 refills | Status: DC

## 2017-09-28 NOTE — Telephone Encounter (Signed)
MR Brain w/wo contrast Dr. Stephanie Acre Auth: [id number] (exp. 09/28/17 to 10/27/17). Pt is scheduled for 09/29/17 at [facility]

## 2017-09-28 NOTE — Progress Notes (Signed)
Reason for visit: Dizziness  Referring physician: Dr. Rolanda Jay is a 62 y.o. female  History of present illness:  Jessica [name] is a 62 year old right-handed white female with a history of vertigo that began suddenly in early March 2019.  The patient had been exercising on her back the night before, the next day, she had onset of true vertigo that was associated with a headache that was on the top and back of the head.  The patient went to the hospital and was admitted for 2 days.  MRI of the brain apparently was done during that hospitalization, I do not have the report of the study, the referral notes indicate that this was unremarkable.  This was done without contrast according to the patient.  The patient had resolution of the vertigo, but she has had evolution of new symptoms since that time.  The patient has a persistent squeezing sensation in the throat and around the head that is present from sun up to sundown.  At times, the patient may have more significant headaches that are in the frontal areas, at times the headache may be severe.  The patient denies any prior history of headache before the onset of the dizziness.  She has also developed some thick sinus drainage, soreness of the throat, popping of the ears with swallowing.  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  The patient reports bilateral tinnitus.  She denies any numbness or weakness of the face, arms, legs.  She denies any visual disturbances such as double vision or blurred vision.  She does not note any positional component to her dizziness, she is no longer having true vertigo.  Initially, with the vertigo she was unable to walk well, she no longer has any balance problems.  She denies issues controlling the bowels or the bladder.  She has recently undergone a carotid Doppler study, she has not gotten the report of this yet.  She denies  any significant neck pain.  She is sent to this office for an evaluation.  She has been placed on diazepam without much benefit.  Even during the severe episode of vertigo, the patient denies any nausea or vomiting.  With the headaches, she denies any significant photophobia or phonophobia.  Past Medical History:  Diagnosis Date  . Anxiety   . Breast mass, right   . Esophageal stricture   . GERD (gastroesophageal reflux disease)   . Hiatal hernia   . Hyperlipidemia   . Hypertrophy of breast 01/2014  . Hypothyroidism   . TMJ syndrome   . Vertigo     Past Surgical History:  Procedure Laterality Date  . Barbie Banner OSTEOTOMY Right 04/11/2014   Procedure: Barbie Banner OSTEOTOMY;  Surgeon: Marcheta Grammes, DPM;  Location: [facility];  Service: Podiatry;  Laterality: Right;  . BREAST LUMPECTOMY Right 07/29/2017   Procedure: RIGHT BREAST LUMPECTOMY;  Surgeon: Coralie Keens, MD;  Location: [facility];  Service: General;  Laterality: Right;  . BREAST REDUCTION SURGERY Bilateral 01/16/2014   Procedure: BILATERAL MAMMARY REDUCTION  (BREAST);  Surgeon: Charlene Brooke, MD;  Location: [facility];  Service: Plastics;  Laterality: Bilateral;  . BUNIONECTOMY Right 04/11/2014   Procedure: Altamese [place];  Surgeon: Marcheta Grammes, DPM;  Location: [facility];  Service: Podiatry;  Laterality: Right;  . BUNIONECTOMY Right 12/25/2015   Procedure: REVISION Altamese [place];  Surgeon: Caprice Beaver, DPM;  Location:  [facility];  Service: Podiatry;  Laterality: Right;  . COLONOSCOPY    . FLEXOR TENOTOMY Right 04/11/2014   Procedure: PERCUTANEOUS FLEXOR TENOTOMY 3RD DIGIT;  Surgeon: Marcheta Grammes, DPM;  Location: [facility];  Service: Podiatry;  Laterality: Right;  . HALLUX VALGUS BASE WEDGE Right 12/25/2015   Procedure: CLOSING BASE WEDGE OSTEOTOMY;  Surgeon: Caprice Beaver, DPM;  Location: [facility];  Service: Podiatry;  Laterality: Right;  . mohs procedure Left  12/2016   2 areas of skin cancer on left leg   . WEIL OSTEOTOMY Right 04/11/2014   Procedure: WEIL OSTEOTOMY 2ND METATARSAL RIGHT FOOT;  Surgeon: Marcheta Grammes, DPM;  Location: [facility];  Service: Podiatry;  Laterality: Right;    Family History  Problem Relation Age of Onset  . Stroke Mother   . Heart disease Father   . Other Son 21       suicide  . Colon cancer Maternal Grandfather   . Throat cancer Neg Hx   . Stomach cancer Neg Hx   . Rectal cancer Neg Hx   . Esophageal cancer Neg Hx     Social history:  reports that she has never smoked. She has never used smokeless tobacco. She reports that she drinks alcohol. She reports that she does not use drugs.  Medications:  Prior to Admission medications   Medication Sig Start Date End Date Taking? Authorizing Provider  aspirin EC 81 MG tablet Take 81 mg by mouth daily.    [provider]  atorvastatin (LIPITOR) 80 MG tablet Take 80 mg by mouth daily.    [provider]  azelastine (ASTELIN) 0.1 % nasal spray Place into both nostrils 2 (two) times daily. Use in each nostril as directed    [provider]  cetirizine (ZYRTEC) 10 MG tablet Take 10 mg by mouth daily.    [provider]  diazepam (VALIUM) 2 MG tablet Take 2 mg by mouth every 6 (six) hours as needed for anxiety.    [provider]  esomeprazole (NEXIUM) 40 MG capsule Take 40 mg by mouth daily.     [provider]  levothyroxine (SYNTHROID, LEVOTHROID) 88 MCG tablet Take 100 mcg by mouth daily before breakfast.     [provider]  meclizine (ANTIVERT) 25 MG tablet Take 25 mg by mouth 3 (three) times daily as needed for dizziness.    [provider]  nortriptyline (PAMELOR) 10 MG capsule Take one capsule at night for one week, then take 2 capsules at night for one week, then take 3 capsules at night 09/28/17   Jessica Ducking, MD  sertraline (ZOLOFT) 100 MG tablet Take 150 mg by mouth daily.      [provider]  traMADol (ULTRAM) 50 MG tablet Take 1-2 tablets (50-100 mg total) by mouth every 6 (six) hours as needed for moderate pain. 07/29/17   Coralie Keens, MD     No Known Allergies  ROS:  Out of a complete 14 system review of symptoms, the patient complains only of the following symptoms, and all other reviewed systems are negative.  Ringing in the ears, difficulty swallowing Shortness of breath, cough Allergies, runny nose Dizziness  Blood pressure 102/60, pulse (!) 58, height 5' 5.5" (1.664 m), weight 186 lb 8 oz (84.6 kg), SpO2 98 %.  Physical Exam  General: The patient is alert and cooperative at the time of the examination.  Patient is mildly obese.  Eyes: Pupils are equal, round, and reactive to  light. Discs are flat bilaterally.  Ears: Tympanic membranes are clear bilaterally.  Neck: The neck is supple, no carotid bruits are noted.  Respiratory: The respiratory examination is clear.  Cardiovascular: The cardiovascular examination reveals a regular rate and rhythm, no obvious murmurs or rubs are noted.  Neuromuscular: Range of movement of the cervical spine is full.  Skin: Extremities are without significant edema.  Neurologic Exam  Mental status: The patient is alert and oriented x 3 at the time of the examination. The patient has apparent normal recent and remote memory, with an apparently normal attention span and concentration ability.  Cranial nerves: Facial symmetry is present. There is good sensation of the face to pinprick and soft touch bilaterally. The strength of the facial muscles and the muscles to head turning and shoulder shrug are normal bilaterally. Speech is well enunciated, no aphasia or dysarthria is noted. Extraocular movements are full. Visual fields are full. The tongue is midline, and the patient has symmetric elevation of the soft palate. No obvious hearing deficits are noted.  Motor: The motor testing reveals 5 over 5  strength of all 4 extremities. Good symmetric motor tone is noted throughout.  Sensory: Sensory testing is intact to pinprick, soft touch, vibration sensation, and position sense on all 4 extremities. No evidence of extinction is noted.  Coordination: Cerebellar testing reveals good finger-nose-finger and heel-to-shin bilaterally.  Gait and station: Gait is normal. Tandem gait is normal. Romberg is negative. No drift is seen.  Reflexes: Deep tendon reflexes are symmetric and normal bilaterally. Toes are downgoing bilaterally.   Assessment/Plan:  1.  Dizziness, lightheaded sensations  2.  Pressure sensation of the head, headache  The patient had onset of what sounds like true vertigo, she claims that she was not able to ambulate because of the vertigo yet surprisingly had no nausea or vomiting.  The patient also reported a headache with the onset of the vertigo which is unusual for a vestibular issue.  The patient does not have a prior history of headache.  Initial MRI of the brain report is not available to me, it apparently did not show evidence of a stroke event.  The patient is now complaining of sinus drainage and popping of the ears that was not present initially.  MRI of the brain will be repeated with and without gadolinium enhancement.  The patient does have features that could be consistent with a tension type headache.  She will be started on nortriptyline working up to 30 mg dose.  She will follow-up here in about 3 to 4 months.  A carotid Doppler study has been done.  Blood work will be done today.  Jill Alexanders MD 09/28/2017 9:02 AM  [facility] [address] [place], [place] [address]  Phone [phone] Fax [phone]

## 2017-09-28 NOTE — Patient Instructions (Signed)
We will get MRI of the brain and start nortriptyline for the headache.  Pamelor (nortriptyline) is an antidepressant medication that has many uses that may include headache, whiplash injuries, or for peripheral neuropathy pain. Side effects may include drowsiness, dry mouth, blurred vision, or constipation. As with any antidepressant medication, worsening depression may occur. If you had any significant side effects, please call our office. The full effects of this medication may take 7-10 days after starting the drug, or going up on the dose.

## 2017-09-29 ENCOUNTER — Ambulatory Visit (INDEPENDENT_AMBULATORY_CARE_PROVIDER_SITE_OTHER): Payer: BLUE CROSS/BLUE SHIELD

## 2017-09-29 DIAGNOSIS — H81399 Other peripheral vertigo, unspecified ear: Secondary | ICD-10-CM

## 2017-09-29 LAB — COMPREHENSIVE METABOLIC PANEL
ALBUMIN: 4.5 g/dL (ref 3.6–4.8)
ALK PHOS: 66 IU/L (ref 39–117)
ALT: 27 IU/L (ref 0–32)
AST: 19 IU/L (ref 0–40)
Albumin/Globulin Ratio: 2 (ref 1.2–2.2)
BILIRUBIN TOTAL: 0.2 mg/dL (ref 0.0–1.2)
BUN/Creatinine Ratio: 19 (ref 12–28)
BUN: 17 mg/dL (ref 8–27)
CHLORIDE: 102 mmol/L (ref 96–106)
CO2: 26 mmol/L (ref 20–29)
CREATININE: 0.91 mg/dL (ref 0.57–1.00)
Calcium: 9.3 mg/dL (ref 8.7–10.3)
GFR calc Af Amer: 79 mL/min/{1.73_m2} (ref >59)
GFR calc non Af Amer: 69 mL/min/{1.73_m2} (ref >59)
GLUCOSE: 101 mg/dL — AB (ref 65–99)
Globulin, Total: 2.2 g/dL (ref 1.5–4.5)
Potassium: 4.3 mmol/L (ref 3.5–5.2)
Sodium: 144 mmol/L (ref 134–144)
Total Protein: 6.7 g/dL (ref 6.0–8.5)

## 2017-09-29 LAB — SEDIMENTATION RATE: SED RATE: 2 mm/h (ref 0–40)

## 2017-09-29 MED ORDER — GADOPENTETATE DIMEGLUMINE 469.01 MG/ML IV SOLN
15.0000 mL | Freq: Once | INTRAVENOUS | Status: AC | PRN
  Administered 2017-09-29: 15 mL via INTRAVENOUS

## 2017-10-01 ENCOUNTER — Telehealth: Payer: Self-pay | Admitting: Neurology

## 2017-10-01 NOTE — Telephone Encounter (Signed)
  I called the patient.  MRI of the brain is completely normal, no evidence of sinus disease or evidence of eustachian tube dysfunction.  The patient will be going on nortriptyline for the headache issues.  I suppose that chronic use of Zoloft could potentially result in dizziness, a taper down on this medication the future may be considered.  MRI brain 09/30/17:  IMPRESSION:   Normal MRI brain (with and without).

## 2017-10-07 ENCOUNTER — Encounter: Payer: Self-pay | Admitting: Neurology

## 2017-10-08 ENCOUNTER — Other Ambulatory Visit: Payer: Self-pay | Admitting: Neurology

## 2017-10-08 MED ORDER — GABAPENTIN 300 MG PO CAPS: ORAL_CAPSULE | ORAL | 3 refills | Status: DC

## 2017-10-20 ENCOUNTER — Encounter: Payer: Self-pay | Admitting: Neurology

## 2017-10-21 ENCOUNTER — Telehealth: Payer: Self-pay | Admitting: Neurology

## 2017-10-21 MED ORDER — CYCLOBENZAPRINE HCL 5 MG PO TABS: 5.0000 mg | ORAL_TABLET | Freq: Every evening | ORAL | 0 refills | Status: DC | PRN

## 2017-10-21 NOTE — Telephone Encounter (Signed)
Please advise patient, that she can stay off the gabapentin. Dr. Jannifer Franklin may be able to suggest an alternative when he is back. In the meantime, she can try a small dose of flexeril at night, 5 mg, which is a muscle relaxer. Side effects may include dizziness, sleepiness, mouth dryness, feeling off balance, so please be mindful. She may not be able to drive after taking it.

## 2017-10-21 NOTE — Telephone Encounter (Signed)
Sent pt mychart message

## 2017-12-09 ENCOUNTER — Other Ambulatory Visit: Payer: Self-pay | Admitting: Specialist

## 2017-12-09 DIAGNOSIS — R921 Mammographic calcification found on diagnostic imaging of breast: Secondary | ICD-10-CM

## 2018-01-21 ENCOUNTER — Ambulatory Visit: Payer: BLUE CROSS/BLUE SHIELD

## 2018-01-21 ENCOUNTER — Ambulatory Visit
Admission: RE | Admit: 2018-01-21 | Discharge: 2018-01-21 | Disposition: A | Discharge status: Home / Self Care | Payer: BLUE CROSS/BLUE SHIELD | Source: Ambulatory Visit | Attending: Specialist | Admitting: Specialist

## 2018-01-21 ENCOUNTER — Encounter: Payer: Self-pay | Admitting: Neurology

## 2018-01-21 ENCOUNTER — Ambulatory Visit (INDEPENDENT_AMBULATORY_CARE_PROVIDER_SITE_OTHER): Payer: BLUE CROSS/BLUE SHIELD | Admitting: Neurology

## 2018-01-21 VITALS — BP 122/80 | HR 58 | Ht 65.5 in | Wt 188.0 lb

## 2018-01-21 DIAGNOSIS — R921 Mammographic calcification found on diagnostic imaging of breast: Secondary | ICD-10-CM

## 2018-01-21 DIAGNOSIS — R42 Dizziness and giddiness: Secondary | ICD-10-CM | POA: Diagnosis not present

## 2018-01-21 DIAGNOSIS — G4489 Other headache syndrome: Secondary | ICD-10-CM

## 2018-01-21 MED ORDER — CYCLOBENZAPRINE HCL 5 MG PO TABS: 5.0000 mg | ORAL_TABLET | Freq: Every day | ORAL | 3 refills | Status: DC

## 2018-01-21 NOTE — Progress Notes (Signed)
Reason for visit: Headache, dizziness  Jessica [name] is an 62 y.o. female   History of present illness:  Jessica [name] is a 62 year old right-handed white female with a history of vertigo and dizziness, she also has had headaches associated with a squeezing sensation or a viselike sensation on the head.  The patient was placed on nortriptyline and then eventually gabapentin, she indicated that both of these medications worsened her pain.  The patient did take low-dose Flexeril at night which seemed to help some.  She went off of her medications and had no problems throughout the summer 2019.  Within the last 2 weeks, she has just started to have some pressure sensation in the head again and some mild wooziness.  The patient is concerned that the headache may be coming back on.  The patient does have allergy symptoms, she is on Zyrtec.  The patient is concerned that her allergies may be causing some troubles as well.  She has also reported some recent troubles with remembering names for people.  She has been on a statin drug since February 2019.  The patient has not given up any activities of daily living because of memory.  She has had a recent MRI of the brain that was completely normal with and without gadolinium enhancement.  Past Medical History:  Diagnosis Date  . Anxiety   . Breast mass, right   . Esophageal stricture   . GERD (gastroesophageal reflux disease)   . Hiatal hernia   . Hyperlipidemia   . Hypertrophy of breast 01/2014  . Hypothyroidism   . TMJ syndrome   . Vertigo     Past Surgical History:  Procedure Laterality Date  . Barbie Banner OSTEOTOMY Right 04/11/2014   Procedure: Barbie Banner OSTEOTOMY;  Surgeon: Marcheta Grammes, DPM;  Location: [facility];  Service: Podiatry;  Laterality: Right;  . BREAST LUMPECTOMY Right 07/29/2017   Procedure: RIGHT BREAST LUMPECTOMY;  Surgeon: Coralie Keens, MD;  Location: [facility];  Service: General;  Laterality: Right;    . BREAST REDUCTION SURGERY Bilateral 01/16/2014   Procedure: BILATERAL MAMMARY REDUCTION  (BREAST);  Surgeon: Charlene Brooke, MD;  Location: [facility];  Service: Plastics;  Laterality: Bilateral;  . BUNIONECTOMY Right 04/11/2014   Procedure: Altamese [place];  Surgeon: Marcheta Grammes, DPM;  Location: [facility];  Service: Podiatry;  Laterality: Right;  . BUNIONECTOMY Right 12/25/2015   Procedure: REVISION Altamese [place];  Surgeon: Caprice Beaver, DPM;  Location: [facility];  Service: Podiatry;  Laterality: Right;  . COLONOSCOPY    . FLEXOR TENOTOMY Right 04/11/2014   Procedure: PERCUTANEOUS FLEXOR TENOTOMY 3RD DIGIT;  Surgeon: Marcheta Grammes, DPM;  Location: [facility];  Service: Podiatry;  Laterality: Right;  . HALLUX VALGUS BASE WEDGE Right 12/25/2015   Procedure: CLOSING BASE WEDGE OSTEOTOMY;  Surgeon: Caprice Beaver, DPM;  Location: [facility];  Service: Podiatry;  Laterality: Right;  . mohs procedure Left 12/2016   2 areas of skin cancer on left leg   . WEIL OSTEOTOMY Right 04/11/2014   Procedure: WEIL OSTEOTOMY 2ND METATARSAL RIGHT FOOT;  Surgeon: Marcheta Grammes, DPM;  Location: [facility];  Service: Podiatry;  Laterality: Right;    Family History  Problem Relation Age of Onset  . Stroke Mother   . Heart disease Father   . Other Son 21       suicide  . Colon cancer Maternal Grandfather   . Throat cancer Neg Hx   . Stomach cancer  Neg Hx   . Rectal cancer Neg Hx   . Esophageal cancer Neg Hx     Social history:  reports that she has never smoked. She has never used smokeless tobacco. She reports that she drinks alcohol. She reports that she does not use drugs.   No Known Allergies  Medications:  Prior to Admission medications   Medication Sig Start Date End Date Taking? Authorizing Provider  aspirin EC 81 MG tablet Take 81 mg by mouth daily.   Yes [provider]  atorvastatin (LIPITOR) 80 MG tablet Take 80 mg by mouth daily.    Yes [provider]  azelastine (ASTELIN) 0.1 % nasal spray Place into both nostrils 2 (two) times daily. Use in each nostril as directed   Yes [provider]  budesonide (RHINOCORT AQUA) 32 MCG/ACT nasal spray Place 1 spray into both nostrils daily.   Yes [provider]  cetirizine (ZYRTEC) 10 MG tablet Take 10 mg by mouth daily.   Yes [provider]  cyclobenzaprine (FLEXERIL) 5 MG tablet Take 1 tablet (5 mg total) by mouth at bedtime as needed for muscle spasms. 10/21/17  Yes Star Age, MD  esomeprazole (NEXIUM) 40 MG capsule Take 40 mg by mouth daily. Pt takes BRAND   Yes [provider]  Ibuprofen (MOTRIN PO) Take 1 Dose by mouth as needed.   Yes [provider]  levothyroxine (SYNTHROID) 100 MCG tablet Take 100 mcg by mouth daily before breakfast. Pt takes BRAND   Yes [provider]  montelukast (SINGULAIR) 10 MG tablet Take 10 mg by mouth at bedtime.   Yes [provider]  SERTRALINE HCL PO Take 200 mg by mouth daily.   Yes [provider]    ROS:  Out of a complete 14 system review of symptoms, the patient complains only of the following symptoms, and all other reviewed systems are negative.  Eye redness, light sensitivity Flushing Dizziness  Blood pressure 122/80, pulse (!) 58, height 5' 5.5" (1.664 m), weight 188 lb (85.3 kg).  Physical Exam  General: The patient is alert and cooperative at the time of the examination.  The patient is moderately obese.  Neuromuscular: Range of movement the cervical spine is full.  Skin: No significant peripheral edema is noted.   Neurologic Exam  Mental status: The patient is alert and oriented x 3 at the time of the examination. The patient has apparent normal recent and remote memory, with an apparently normal attention span and concentration ability.   Cranial nerves: Facial symmetry is present. Speech is normal, no aphasia or dysarthria is noted.  Extraocular movements are full. Visual fields are full.  Motor: The patient has good strength in all 4 extremities.  Sensory examination: Soft touch sensation is symmetric on the face, arms, and legs.  Coordination: The patient has good finger-nose-finger and heel-to-shin bilaterally.  Gait and station: The patient has a normal gait. Tandem gait is normal. Romberg is negative. No drift is seen.  Reflexes: Deep tendon reflexes are symmetric.    MRI brain 09/30/17:  IMPRESSION:   Normal MRI brain (with and without).  * MRI scan images were reviewed online. I agree with the written report.   Assessment/Plan:  1.  Headache, dizziness  The patient may have a form of a tension headache.  The patient will get back on low-dose Flexeril which she was able to tolerate taking 5 mg at night.  A prescription was sent in.  The patient will follow-up  in 6 months, sooner if needed.  She will call if she is not doing well.  It is possible that the statin drug may have some cognitive side effects with her, if the memory worsens, she may have to stop the medication for several months to determine if this is the etiology of her symptoms.  Jill Alexanders MD 01/21/2018 10:34 AM  [facility] [address] [place], [place] [address]  Phone [phone] Fax [phone]

## 2018-09-20 ENCOUNTER — Telehealth: Payer: Self-pay | Admitting: *Deleted

## 2018-09-20 NOTE — Telephone Encounter (Signed)
LMVM for pt to return call to r/s appt from 09-29-18 to another date.

## 2018-09-21 NOTE — Telephone Encounter (Signed)
Pt has r/s for July

## 2018-09-21 NOTE — Telephone Encounter (Signed)
Noted  

## 2018-09-29 ENCOUNTER — Ambulatory Visit: Payer: BLUE CROSS/BLUE SHIELD | Admitting: Neurology

## 2018-09-29 ENCOUNTER — Ambulatory Visit: Payer: BLUE CROSS/BLUE SHIELD | Admitting: Adult Health

## 2018-09-30 ENCOUNTER — Encounter: Payer: Self-pay | Admitting: Physician Assistant

## 2018-09-30 ENCOUNTER — Ambulatory Visit (INDEPENDENT_AMBULATORY_CARE_PROVIDER_SITE_OTHER): Payer: BC Managed Care – PPO | Admitting: Physician Assistant

## 2018-09-30 VITALS — BP 120/80 | HR 53 | Temp 98.3°F | Ht 66.0 in | Wt 184.0 lb

## 2018-09-30 DIAGNOSIS — K649 Unspecified hemorrhoids: Secondary | ICD-10-CM

## 2018-09-30 DIAGNOSIS — R12 Heartburn: Secondary | ICD-10-CM

## 2018-09-30 DIAGNOSIS — K5900 Constipation, unspecified: Secondary | ICD-10-CM

## 2018-09-30 DIAGNOSIS — K2970 Gastritis, unspecified, without bleeding: Secondary | ICD-10-CM | POA: Diagnosis not present

## 2018-09-30 DIAGNOSIS — K219 Gastro-esophageal reflux disease without esophagitis: Secondary | ICD-10-CM | POA: Diagnosis not present

## 2018-09-30 DIAGNOSIS — R1013 Epigastric pain: Secondary | ICD-10-CM

## 2018-09-30 MED ORDER — AMBULATORY NON FORMULARY MEDICATION: 1 refills | Status: DC

## 2018-09-30 MED ORDER — ESOMEPRAZOLE MAGNESIUM 40 MG PO CPDR: 40.0000 mg | DELAYED_RELEASE_CAPSULE | Freq: Two times a day (BID) | ORAL | 0 refills | Status: AC

## 2018-09-30 MED ORDER — NA SULFATE-K SULFATE-MG SULF 17.5-3.13-1.6 GM/177ML PO SOLN: ORAL | 0 refills | Status: DC

## 2018-09-30 NOTE — Progress Notes (Addendum)
Subjective:    Patient ID: Jessica [name], female    DOB: 1955-06-24, 63 y.o.   MRN: [id number]  HPI Jessica [name] is a 63 year old white female, known to Dr. Hilarie Fredrickson.  She was last seen in August 2018.  She has history of chronic constipation GERD, hyperlipidemia, anxiety and depression. She had undergone colonoscopy in September 2018 which was a normal exam other than internal hemorrhoids.  She had remote EGD in 2012 per Dr. 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 Patient comes in today with concerns about changes in her bowels onset about 3 months ago with pasty stools and worsening of her chronic constipation.  About 6 weeks ago she developed some chest pain and was not sure whether this was cardiac or GI related she did have a cardiac work-up which was negative.  Now over the past 2 weeks she has had a significant amount of belching and burping says she feels full in her chest and upper abdomen all the time and has noted increase in reflux as well.  She has not had a bowel movement in about 10 days.  She has tried glycerin suppositories, took a fleets enema today without any results.  She did try adding MiraLAX once daily at night for a few days without benefit.  She is usually managed her constipation with glycerin suppositories about twice daily which has generally worked.  She had been tried on Amitiza in the past which made her sick and says Linzess was ineffective.  She is very concerned and frustrated with this severe obstipation and now increase in reflux and belching.  She denies any abdominal pain, no vomiting and though she does not feel like eating a lot she has been able to eat. On further questioning she had been taking 800 mg ibuprofen daily over the past couple of months, she has continued Nexium 40 mg once daily.  Review of Systems Pertinent positive and negative review of systems were noted in the above HPI section.  All  other review of systems was otherwise negative.  Outpatient Encounter Medications as of 09/30/2018  Medication Sig  . aspirin EC 81 MG tablet Take 81 mg by mouth daily.  Marland Kitchen atorvastatin (LIPITOR) 40 MG tablet Take 40 mg by mouth daily.  Marland Kitchen azelastine (ASTELIN) 0.1 % nasal spray Place into both nostrils 2 (two) times daily. Use in each nostril as directed  . budesonide (RHINOCORT AQUA) 32 MCG/ACT nasal spray Place 1 spray into both nostrils daily.  . cetirizine (ZYRTEC) 10 MG tablet Take 10 mg by mouth daily.  . cyclobenzaprine (FLEXERIL) 5 MG tablet Take 1 tablet (5 mg total) by mouth at bedtime.  Marland Kitchen esomeprazole (NEXIUM) 40 MG capsule Take 1 capsule (40 mg total) by mouth 2 (two) times daily before a meal. Pt takes BRAND  Take before breakfast and dinner.  . gabapentin (NEURONTIN) 100 MG capsule Take 100 mg by mouth 2 (two) times daily.  . Ibuprofen (MOTRIN PO) Take 1 Dose by mouth as needed.  Marland Kitchen levothyroxine (SYNTHROID) 100 MCG tablet Take 100 mcg by mouth daily before breakfast. Pt takes BRAND  . montelukast (SINGULAIR) 10 MG tablet Take 10 mg by mouth at bedtime.  . predniSONE (DELTASONE) 5 MG tablet Take 5 mg by mouth daily with breakfast.  . SERTRALINE HCL PO Take 200 mg by mouth daily.  Marland Kitchen tiZANidine (ZANAFLEX) 4 MG capsule Take 4 mg by mouth at bedtime as needed  for muscle spasms.  . [DISCONTINUED] esomeprazole (NEXIUM) 40 MG capsule Take 40 mg by mouth daily. Pt takes BRAND  . AMBULATORY NON FORMULARY MEDICATION GI Cocktail - 90 ml of lidocaine 2% 90 ml Dicyclomine 10 mg/76ml 170 Ml Maalox    - Take 30 cc's every 4 hours as needed indigestion/ gastritis.  . Na Sulfate-K Sulfate-Mg Sulf 17.5-3.13-1.6 GM/177ML SOLN Suprep-Use as directed  . [DISCONTINUED] atorvastatin (LIPITOR) 80 MG tablet Take 80 mg by mouth daily.   No facility-administered encounter medications on file as of 09/30/2018.    No Known Allergies There are no active problems to display for this patient.  Social History    Socioeconomic History  . Marital status: Married    Spouse name: Not on file  . Number of children: 3  . Years of education: Not on file  . Highest education level: Not on file  Occupational History  . Occupation: homemaker  Social Needs  . Financial resource strain: Not on file  . Food insecurity    Worry: Not on file    Inability: Not on file  . Transportation needs    Medical: Not on file    Non-medical: Not on file  Tobacco Use  . Smoking status: Never Smoker  . Smokeless tobacco: Never Used  Substance and Sexual Activity  . Alcohol use: Yes    Comment: occasionally  . Drug use: No  . Sexual activity: Yes    Birth control/protection: Post-menopausal  Lifestyle  . Physical activity    Days per week: Not on file    Minutes per session: Not on file  . Stress: Not on file  Relationships  . Social Herbalist on phone: Not on file    Gets together: Not on file    Attends religious service: Not on file    Active member of club or organization: Not on file    Attends meetings of clubs or organizations: Not on file    Relationship status: Not on file  . Intimate partner violence    Fear of current or ex partner: Not on file    Emotionally abused: Not on file    Physically abused: Not on file    Forced sexual activity: Not on file  Other Topics Concern  . Not on file  Social History Narrative   Lives   Caffeine use    Jessica [name]'s family history includes Colon cancer in her maternal grandfather; Heart disease in her father; Other (age of onset: 14) in her son; Stroke in her mother.      Objective:    Vitals:   09/30/18 1010  BP: 120/80  Pulse: (!) 53  Temp: 98.3 F (36.8 C)    Physical Exam;Well-developed well-nourished female in no acute distress.   BMI29.7  HEENT; nontraumatic normocephalic, EOMI, PER R LA, sclera anicteric. Oropharynx; not examined/wearing mask/COVID Neck; supple, no JVD Cardiovascular; regular rate and rhythm with  S1-S2, no murmur rub or gallop Pulmonary; Clear bilaterally Abdomen; soft, mild rather generalized tenderness, nondistended, no palpable mass or hepatosplenomegaly, bowel sounds are active Rectal; no impaction, no stool in the rectal vault, there is a small inflamed swollen external hemorrhoid nonthrombosed Skin; benign exam, no jaundice rash or appreciable lesions Extremities; no clubbing cyanosis or edema skin warm and dry Neuro/Psych; alert and oriented x4, grossly nonfocal mood and affect appropriate       Assessment & Plan:   #59 63 year old white female with history of chronic constipation with worsening  of symptoms over the past 3 months initially with development of pasty stools and now progressive constipation with no bowel movement in almost 2 weeks. Patient usually manages with glycerin suppositories twice weekly now ineffective  #2 chronic GERD with exacerbation over the past 2 weeks with belching burping and increased reflux. This may be in part due to severe obstipation, she had also been taking 800 mg ibuprofen regularly and may have component of reflux esophagitis and/or gastritis.  #3 symptomatic external hemorrhoid nonthrombosed #4 history of internal hemorrhoids  Plan; Advised to stop ibuprofen Increase Nexium to 40 mg p.o. twice daily AC breakfast and AC dinner, new prescription  Trial of GI cocktail 30 cc every 4 hours as needed as 1 to 2 weeks. Patient will be given a bowel purge with a Suprep she will complete this weekend. After bowel purge start MiraLAX 17 g in 8 ounces of water every day, she may use glycerin suppositories as needed. I have asked her to call back next week with a progress report.  If she has not feeling better and has not had good results with bowel purge she will need further abdominal imaging. Patient asked to use RectiCare advanced 3-4 times daily for external hemorrhoidal discomfort.  Abishai Viegas Genia Harold PA-C 09/30/2018   Cc: Talmage Coin, MD   Addendum: Reviewed and agree with assessment and management plan. Pyrtle, Lajuan Lines, MD

## 2018-09-30 NOTE — Patient Instructions (Addendum)
If you are age 62 or older, your body mass index should be between 23-30. Your Body mass index is 29.7 kg/m. If this is out of the aforementioned range listed, please consider follow up with your Primary Care Provider.  If you are age 61 or younger, your body mass index should be between 19-25. Your Body mass index is 29.7 kg/m. If this is out of the aformentioned range listed, please consider follow up with your Primary Care Provider.   We have sent the following medications to your pharmacy for you to pick up at your convenience: Gi Cocktail Nexium  - increase to twice daily for one month. Suprep - follow instructions on box.  Or Miralax bowel purge. See attached.   Miralax  - 17 gm daily in 8 ounces of water everyday - increase to twice daily if needed.  STOP IBUPROFEN Dicontinue Benefiber. Try hemorrhoidal cream - Recticare for external hemorrhoid  Call back with an update next week and speak with Eustaquio Maize (nurse).  Thank you for choosing me and [facility].   Amy Esterwood, PA-C

## 2018-10-03 ENCOUNTER — Ambulatory Visit: Payer: BLUE CROSS/BLUE SHIELD | Admitting: Nurse Practitioner

## 2018-10-05 ENCOUNTER — Telehealth: Payer: Self-pay | Admitting: Internal Medicine

## 2018-10-05 NOTE — Telephone Encounter (Signed)
If she cannot get BID Nexium - she should continue daily Nexium -  Do we need to pre auth for BID to get covered for one month?..if they will not cover then she can add pepcid 40 mg po q pm - send rx #30/3  She has not done well with prescription meds for constipation in past - Ok to continue BID Miralax, add Magnesium tabs 400 mg po BID

## 2018-10-05 NOTE — Telephone Encounter (Signed)
Patient calling back from visit last week with Amy. States she did the suprep over the weekend and it helped. She states she has not been able to get the Nexium BID dosing yet, insurance has not approved it. GI cocktail she states was awful and she took a few doses but it didn't help. States she was told to take miralax daily but has increased it to 2 doses daily and has not had a BM since the suprep. Pt states she was told by Amy to call back with an update. Reports she is still having epigastric pain and a lot of burning/belching.Please advise.

## 2018-10-05 NOTE — Telephone Encounter (Signed)
Left message for pt to call back  °

## 2018-10-05 NOTE — Telephone Encounter (Signed)
Pt reported that she is still having chest discomfot, GERD symptoms and burping.  Please advise.

## 2018-10-06 NOTE — Telephone Encounter (Signed)
Spoke with pt and she is aware.

## 2018-10-06 NOTE — Telephone Encounter (Signed)
Left message for pt to call back  °

## 2018-10-11 NOTE — Progress Notes (Signed)
PATIENT: Jessica [name] DOB: 1956-03-09  REASON FOR VISIT: follow up headache, dizziness HISTORY FROM: patient  HISTORY OF PRESENT ILLNESS: Today 10/12/18  Jessica [name] is a 63 year old female with history of vertigo, dizziness, and headaches.  In the past, she had tried nortriptyline and gabapentin, she reports both of these medications have worsened the pain.  She has been doing better with her vertigo, initial onset was about 1 year ago, lasted for 6 months.  She reports about 1 month ago, she had a recurrence of symptoms.  She reports onset of dizziness, when looking downward.  She reports dizziness occurs first, then followed by frontal headache.  She says when she gets moving around the feeling may subside.  She reports the active looking down, triggers the dizziness and headache.  She is taking 5 mg Flexeril at bedtime, it is beneficial.  She reports some photophobia, nausea.  She denies numbness or weakness in her arms or legs.  She reports her symptoms are nearly identical, to her prior presentation.  She does not feel dizzy when standing.  She denies trouble ambulating or with her balance.  She denies numbness or weakness in her arms or legs.  Dizziness, headache does not occur constant, but it is nearly daily, triggered by looking downward.  She presents for follow-up unaccompanied.  HISTORY  01/21/2018 Dr. Jannifer Franklin: Ms. Rotundo is a 63 year old right-handed white female with a history of vertigo and dizziness, she also has had headaches associated with a squeezing sensation or a viselike sensation on the head.  The patient was placed on nortriptyline and then eventually gabapentin, she indicated that both of these medications worsened her pain.  The patient did take low-dose Flexeril at night which seemed to help some.  She went off of her medications and had no problems throughout the summer 2019.  Within the last 2 weeks, she has just started to have some pressure sensation in the  head again and some mild wooziness.  The patient is concerned that the headache may be coming back on.  The patient does have allergy symptoms, she is on Zyrtec.  The patient is concerned that her allergies may be causing some troubles as well.  She has also reported some recent troubles with remembering names for people.  She has been on a statin drug since February 2019.  The patient has not given up any activities of daily living because of memory.  She has had a recent MRI of the brain that was completely normal with and without gadolinium enhancement.  REVIEW OF SYSTEMS: Out of a complete 14 system review of symptoms, the patient complains only of the following symptoms, and all other reviewed systems are negative.  Dizziness, headache  ALLERGIES: No Known Allergies  HOME MEDICATIONS: Outpatient Medications Prior to Visit  Medication Sig Dispense Refill  . aspirin EC 81 MG tablet Take 81 mg by mouth daily.    Marland Kitchen atorvastatin (LIPITOR) 40 MG tablet Take 40 mg by mouth daily.    Marland Kitchen azelastine (ASTELIN) 0.1 % nasal spray Place into both nostrils 2 (two) times daily. Use in each nostril as directed    . budesonide (RHINOCORT AQUA) 32 MCG/ACT nasal spray Place 1 spray into both nostrils daily.    . cetirizine (ZYRTEC) 10 MG tablet Take 10 mg by mouth daily.    Marland Kitchen esomeprazole (NEXIUM) 40 MG capsule Take 1 capsule (40 mg total) by mouth 2 (two) times daily before a meal. Pt takes BRAND  Take  before breakfast and dinner. 60 capsule 0  . levothyroxine (SYNTHROID) 100 MCG tablet Take 100 mcg by mouth daily before breakfast. Pt takes BRAND    . montelukast (SINGULAIR) 10 MG tablet Take 10 mg by mouth at bedtime.    . predniSONE (DELTASONE) 5 MG tablet Take 5 mg by mouth daily with breakfast.    . SERTRALINE HCL PO Take 200 mg by mouth daily.    Marland Kitchen tiZANidine (ZANAFLEX) 4 MG capsule Take 4 mg by mouth at bedtime as needed for muscle spasms.    . cyclobenzaprine (FLEXERIL) 5 MG tablet Take 1 tablet (5 mg  total) by mouth at bedtime. 30 tablet 3  . AMBULATORY NON FORMULARY MEDICATION GI Cocktail - 90 ml of lidocaine 2% 90 ml Dicyclomine 10 mg/54ml 170 Ml Maalox    - Take 30 cc's every 4 hours as needed indigestion/ gastritis. 450 mL 1  . gabapentin (NEURONTIN) 100 MG capsule Take 100 mg by mouth 2 (two) times daily.    . Ibuprofen (MOTRIN PO) Take 1 Dose by mouth as needed.    . Na Sulfate-K Sulfate-Mg Sulf 17.5-3.13-1.6 GM/177ML SOLN Suprep-Use as directed 354 mL 0   No facility-administered medications prior to visit.     PAST MEDICAL HISTORY: Past Medical History:  Diagnosis Date  . Anxiety   . Breast mass, right   . Esophageal stricture   . GERD (gastroesophageal reflux disease)   . Hiatal hernia   . Hyperlipidemia   . Hypertrophy of breast 01/2014  . Hypothyroidism   . TMJ syndrome   . Vertigo     PAST SURGICAL HISTORY: Past Surgical History:  Procedure Laterality Date  . Barbie Banner OSTEOTOMY Right 04/11/2014   Procedure: Barbie Banner OSTEOTOMY;  Surgeon: Marcheta Grammes, DPM;  Location: [facility];  Service: Podiatry;  Laterality: Right;  . BREAST BIOPSY    . BREAST LUMPECTOMY Right 07/29/2017   Procedure: RIGHT BREAST LUMPECTOMY;  Surgeon: Coralie Keens, MD;  Location: [facility];  Service: General;  Laterality: Right;  . BREAST REDUCTION SURGERY Bilateral 01/16/2014   Procedure: BILATERAL MAMMARY REDUCTION  (BREAST);  Surgeon: Charlene Brooke, MD;  Location: [facility];  Service: Plastics;  Laterality: Bilateral;  . BUNIONECTOMY Right 04/11/2014   Procedure: [place];  Surgeon: Marcheta Grammes, DPM;  Location: [facility];  Service: Podiatry;  Laterality: Right;  . BUNIONECTOMY Right 12/25/2015   Procedure: REVISION [place];  Surgeon: Caprice Beaver, DPM;  Location: [facility];  Service: Podiatry;  Laterality: Right;  . COLONOSCOPY    . FLEXOR TENOTOMY Right 04/11/2014   Procedure: PERCUTANEOUS FLEXOR TENOTOMY 3RD  DIGIT;  Surgeon: Marcheta Grammes, DPM;  Location: [facility];  Service: Podiatry;  Laterality: Right;  . HALLUX VALGUS BASE WEDGE Right 12/25/2015   Procedure: CLOSING BASE WEDGE OSTEOTOMY;  Surgeon: Caprice Beaver, DPM;  Location: [facility];  Service: Podiatry;  Laterality: Right;  . mohs procedure Left 12/2016   2 areas of skin cancer on left leg   . WEIL OSTEOTOMY Right 04/11/2014   Procedure: WEIL OSTEOTOMY 2ND METATARSAL RIGHT FOOT;  Surgeon: Marcheta Grammes, DPM;  Location: [facility];  Service: Podiatry;  Laterality: Right;    FAMILY HISTORY: Family History  Problem Relation Age of Onset  . Stroke Mother   . Heart disease Father   . Other Son 21       suicide  . Colon cancer Maternal Grandfather   . Throat cancer Neg Hx   . Stomach cancer  Neg Hx   . Rectal cancer Neg Hx   . Esophageal cancer Neg Hx     SOCIAL HISTORY: Social History   Socioeconomic History  . Marital status: Married    Spouse name: Not on file  . Number of children: 3  . Years of education: Not on file  . Highest education level: Not on file  Occupational History  . Occupation: homemaker  Social Needs  . Financial resource strain: Not on file  . Food insecurity    Worry: Not on file    Inability: Not on file  . Transportation needs    Medical: Not on file    Non-medical: Not on file  Tobacco Use  . Smoking status: Never Smoker  . Smokeless tobacco: Never Used  Substance and Sexual Activity  . Alcohol use: Yes    Comment: occasionally  . Drug use: No  . Sexual activity: Yes    Birth control/protection: Post-menopausal  Lifestyle  . Physical activity    Days per week: Not on file    Minutes per session: Not on file  . Stress: Not on file  Relationships  . Social Herbalist on phone: Not on file    Gets together: Not on file    Attends religious service: Not on file    Active member of club or organization: Not on file    Attends meetings of clubs or organizations: Not  on file    Relationship status: Not on file  . Intimate partner violence    Fear of current or ex partner: Not on file    Emotionally abused: Not on file    Physically abused: Not on file    Forced sexual activity: Not on file  Other Topics Concern  . Not on file  Social History Narrative   Lives   Caffeine use      PHYSICAL EXAM  Vitals:   10/12/18 1247  BP: 129/69  Pulse: 63  Temp: 97.7 F (36.5 C)  Weight: 186 lb 3.2 oz (84.5 kg)  Height: 5\' 6"  (1.676 m)   Body mass index is 30.05 kg/m.  Generalized: Well developed, in no acute distress   Neurological examination  Mentation: Alert oriented to time, place, history taking. Follows all commands speech and language fluent Cranial nerve II-XII: Pupils were equal round reactive to light. Extraocular movements were full, visual field were full on confrontational test. Facial sensation and strength were normal. Uvula tongue midline. Head turning and shoulder shrug  were normal and symmetric. Motor: The motor testing reveals 5 over 5 strength of all 4 extremities. Good symmetric motor tone is noted throughout.  Sensory: Sensory testing is intact to soft touch on all 4 extremities. No evidence of extinction is noted.  Coordination: Cerebellar testing reveals good finger-nose-finger and heel-to-shin bilaterally.  Gait and station: Gait is normal. Tandem gait is normal. Romberg is negative. No drift is seen.  Reflexes: Deep tendon reflexes are symmetric and normal bilaterally.   DIAGNOSTIC DATA (LABS, IMAGING, TESTING) - I reviewed patient records, labs, notes, testing and imaging myself where available.  Lab Results  Component Value Date   WBC 5.9 12/23/2015   HGB 13.0 12/23/2015   HCT 40.9 12/23/2015   MCV 91.7 12/23/2015   PLT 200 12/23/2015      Component Value Date/Time   NA 144 09/28/2017 0907   K 4.3 09/28/2017 0907   CL 102 09/28/2017 0907   CO2 26 09/28/2017 0907   GLUCOSE 101 (  H) 09/28/2017 0907   GLUCOSE  79 12/23/2015 1424   BUN 17 09/28/2017 0907   CREATININE 0.91 09/28/2017 0907   CALCIUM 9.3 09/28/2017 0907   PROT 6.7 09/28/2017 0907   ALBUMIN 4.5 09/28/2017 0907   AST 19 09/28/2017 0907   ALT 27 09/28/2017 0907   ALKPHOS 66 09/28/2017 0907   BILITOT 0.2 09/28/2017 0907   GFRNONAA 69 09/28/2017 0907   GFRAA 79 09/28/2017 0907   No results found for: CHOL, HDL, LDLCALC, LDLDIRECT, TRIG, CHOLHDL No results found for: HGBA1C No results found for: VITAMINB12 No results found for: TSH    ASSESSMENT AND PLAN 63 y.o. year old female  has a past medical history of Anxiety, Breast mass, right, Esophageal stricture, GERD (gastroesophageal reflux disease), Hiatal hernia, Hyperlipidemia, Hypertrophy of breast (01/2014), Hypothyroidism, TMJ syndrome, and Vertigo. here with:  1.  Headache, dizziness  She has been doing very well, a few weeks ago had a recurrence of dizziness and headache.  She describes dizziness that occurs when looking downward, and a frontal headache.  The headache is not constant but is nearly daily.  At this presentation, she is complaining of some photophobia.  In the past she tried nortriptyline and gabapentin, she was not able to tolerate.  Dr. Jannifer Franklin had thought she may have a form of a tension headache.  She will continue taking Flexeril 5 mg at bedtime.  She will do a trial of Topamax, titrating to 75 mg at bedtime to see if this may help her headache/dizziness that could be form of migraine with reported photophobia.  She will follow-up in 3 months or sooner if needed.  I have advised her that if her symptoms worsen or she develops any new symptoms she should let us know.  She had MRI of the brain in June 2019 that was normal.  The current symptoms, are nearly identical to her presentation last year. She will call in in 3-4 weeks if her symptoms are no better. At this point, I do not think she needs vestibular rehab, since she has headache presentation.    I spent 15  minutes with the patient. 50% of this time was spent discussing her plan of care.   Butler Denmark, AGNP-C, DNP 10/12/2018, 1:41 PM [facility] [address] [place], [place] [address] [phone]

## 2018-10-12 ENCOUNTER — Other Ambulatory Visit: Payer: Self-pay

## 2018-10-12 ENCOUNTER — Encounter: Payer: Self-pay | Admitting: Neurology

## 2018-10-12 ENCOUNTER — Ambulatory Visit (INDEPENDENT_AMBULATORY_CARE_PROVIDER_SITE_OTHER): Payer: BC Managed Care – PPO | Admitting: Neurology

## 2018-10-12 DIAGNOSIS — G4489 Other headache syndrome: Secondary | ICD-10-CM | POA: Diagnosis not present

## 2018-10-12 DIAGNOSIS — R519 Headache, unspecified: Secondary | ICD-10-CM | POA: Insufficient documentation

## 2018-10-12 MED ORDER — TOPIRAMATE 25 MG PO TABS: ORAL_TABLET | ORAL | 3 refills | Status: DC

## 2018-10-12 MED ORDER — CYCLOBENZAPRINE HCL 5 MG PO TABS: 5.0000 mg | ORAL_TABLET | Freq: Every day | ORAL | 3 refills | Status: DC

## 2018-10-12 NOTE — Patient Instructions (Signed)
Start taking Topamax 1 tablet at bedtime x 3 days, then increase to 2 tablets at bedtime x 3 days, then increase to 3 tablets Topiramate tablets What is this medicine? TOPIRAMATE (toe PYRE a mate) is used to treat seizures in adults or children with epilepsy. It is also used for the prevention of migraine headaches. This medicine may be used for other purposes; ask your health care provider or pharmacist if you have questions. COMMON BRAND NAME(S): Topamax, Topiragen What should I tell my health care provider before I take this medicine? They need to know if you have any of these conditions:  bleeding disorders  cirrhosis of the liver or liver disease  diarrhea  glaucoma  kidney stones or kidney disease  low blood counts, like low white cell, platelet, or red cell counts  lung disease like asthma, obstructive pulmonary disease, emphysema  metabolic acidosis  on a ketogenic diet  schedule for surgery or a procedure  suicidal thoughts, plans, or attempt; a previous suicide attempt by you or a family member  an unusual or allergic reaction to topiramate, other medicines, foods, dyes, or preservatives  pregnant or trying to get pregnant  breast-feeding How should I use this medicine? Take this medicine by mouth with a glass of water. Follow the directions on the prescription label. Do not crush or chew. You may take this medicine with meals. Take your medicine at regular intervals. Do not take it more often than directed. Talk to your pediatrician regarding the use of this medicine in children. Special care may be needed. While this drug may be prescribed for children as young as 28 years of age for selected conditions, precautions do apply. Overdosage: If you think you have taken too much of this medicine contact a poison control center or emergency room at once. NOTE: This medicine is only for you. Do not share this medicine with others. What if I miss a dose? If you miss a  dose, take it as soon as you can. If your next dose is to be taken in less than 6 hours, then do not take the missed dose. Take the next dose at your regular time. Do not take double or extra doses. What may interact with this medicine? Do not take this medicine with any of the following medications:  probenecid This medicine may also interact with the following medications:  acetazolamide  alcohol  amitriptyline  aspirin and aspirin-like medicines  birth control pills  certain medicines for depression  certain medicines for seizures  certain medicines that treat or prevent blood clots like warfarin, enoxaparin, dalteparin, apixaban, dabigatran, and rivaroxaban  digoxin  hydrochlorothiazide  lithium  medicines for pain, sleep, or muscle relaxation  metformin  methazolamide  NSAIDS, medicines for pain and inflammation, like ibuprofen or naproxen  pioglitazone  risperidone This list may not describe all possible interactions. Give your health care provider a list of all the medicines, herbs, non-prescription drugs, or dietary supplements you use. Also tell them if you smoke, drink alcohol, or use illegal drugs. Some items may interact with your medicine. What should I watch for while using this medicine? Visit your doctor or health care professional for regular checks on your progress. Do not stop taking this medicine suddenly. This increases the risk of seizures if you are using this medicine to control epilepsy. Wear a medical identification bracelet or chain to say you have epilepsy or seizures, and carry a card that lists all your medicines. This medicine can decrease sweating  and increase your body temperature. Watch for signs of deceased sweating or fever, especially in children. Avoid extreme heat, hot baths, and saunas. Be careful about exercising, especially in hot weather. Contact your health care provider right away if you notice a fever or decrease in  sweating. You should drink plenty of fluids while taking this medicine. If you have had kidney stones in the past, this will help to reduce your chances of forming kidney stones. If you have stomach pain, with nausea or vomiting and yellowing of your eyes or skin, call your doctor immediately. You may get drowsy, dizzy, or have blurred vision. Do not drive, use machinery, or do anything that needs mental alertness until you know how this medicine affects you. To reduce dizziness, do not sit or stand up quickly, especially if you are an older patient. Alcohol can increase drowsiness and dizziness. Avoid alcoholic drinks. If you notice blurred vision, eye pain, or other eye problems, seek medical attention at once for an eye exam. The use of this medicine may increase the chance of suicidal thoughts or actions. Pay special attention to how you are responding while on this medicine. Any worsening of mood, or thoughts of suicide or dying should be reported to your health care professional right away. This medicine may increase the chance of developing metabolic acidosis. If left untreated, this can cause kidney stones, bone disease, or slowed growth in children. Symptoms include breathing fast, fatigue, loss of appetite, irregular heartbeat, or loss of consciousness. Call your doctor immediately if you experience any of these side effects. Also, tell your doctor about any surgery you plan on having while taking this medicine since this may increase your risk for metabolic acidosis. Birth control pills may not work properly while you are taking this medicine. Talk to your doctor about using an extra method of birth control. Women who become pregnant while using this medicine may enroll in the [facility] Pregnancy Registry by calling [id number]. This registry collects information about the safety of antiepileptic drug use during pregnancy. What side effects may I notice from receiving  this medicine? Side effects that you should report to your doctor or health care professional as soon as possible:  allergic reactions like skin rash, itching or hives, swelling of the face, lips, or tongue  decreased sweating and/or rise in body temperature  depression  difficulty breathing, fast or irregular breathing patterns  difficulty speaking  difficulty walking or controlling muscle movements  hearing impairment  redness, blistering, peeling or loosening of the skin, including inside the mouth  tingling, pain or numbness in the hands or feet  unusual bleeding or bruising  unusually weak or tired  worsening of mood, thoughts or actions of suicide or dying Side effects that usually do not require medical attention (report to your doctor or health care professional if they continue or are bothersome):  altered taste  back pain, joint or muscle aches and pains  diarrhea, or constipation  headache  loss of appetite  nausea  stomach upset, indigestion  tremors This list may not describe all possible side effects. Call your doctor for medical advice about side effects. You may report side effects to FDA at 1-800-FDA-1088. Where should I keep my medicine? Keep out of the reach of children. Store at room temperature between 15 and 30 degrees C (59 and 86 degrees F) in a tightly closed container. Protect from moisture. Throw away any unused medicine after the expiration date. NOTE: This sheet  is a summary. It may not cover all possible information. If you have questions about this medicine, talk to your doctor, pharmacist, or health care provider.  2020 Elsevier/Gold Standard (2013-04-03 23:17:57)

## 2018-10-12 NOTE — Progress Notes (Signed)
I have read the note, and I agree with the clinical assessment and plan.  Charles K Willis   

## 2018-12-07 ENCOUNTER — Other Ambulatory Visit: Payer: Self-pay | Admitting: Specialist

## 2018-12-07 DIAGNOSIS — Z1231 Encounter for screening mammogram for malignant neoplasm of breast: Secondary | ICD-10-CM

## 2019-01-17 ENCOUNTER — Ambulatory Visit: Payer: BC Managed Care – PPO | Admitting: Neurology

## 2019-01-23 ENCOUNTER — Other Ambulatory Visit: Payer: Self-pay

## 2019-01-23 ENCOUNTER — Ambulatory Visit
Admission: RE | Admit: 2019-01-23 | Discharge: 2019-01-23 | Disposition: A | Discharge status: Home / Self Care | Payer: BC Managed Care – PPO | Source: Ambulatory Visit | Attending: Specialist | Admitting: Specialist

## 2019-01-23 DIAGNOSIS — Z1231 Encounter for screening mammogram for malignant neoplasm of breast: Secondary | ICD-10-CM

## 2019-02-27 NOTE — Progress Notes (Signed)
PATIENT: Jessica [name] DOB: April 01, 1956  REASON FOR VISIT: follow up HISTORY FROM: patient  HISTORY OF PRESENT ILLNESS: Today 02/28/19  Jessica [name] is a 63 year old female with history of vertigo, dizziness, and headaches.  In the past she has tried nortriptyline and gabapentin, she reports both of these medications worsened her headache.  After last visit, she was started on Topamax.  She had MRI of the brain in June 2019 that was normal.  Today, she indicates she is no longer having headache.  She continues to complain of dizziness that is triggered when she lies flat on the floor, or if she is sitting and looks upward.  She may also trigger the dizziness if she turns over in bed.  The dizzness does not affect her balance or gait.  She has seen ENT, unable to determine etiology.  She has been taking Valium 2 mg at bedtime for dizziness.  She stopped Flexeril.  She has not found the Valium to be beneficial.  She is working out 2 days a week with a Clinical research associate.  She is in physical therapy for her back.  She drives a car without difficulty.  She has not had any falls.  She reports she had a carotid Doppler ultrasound last year with her cardiologist.  She presents today for follow-up unaccompanied.  HISTORY 10/12/2018 SS: Jessica [name] is a 63 year old female with history of vertigo, dizziness, and headaches.  In the past, she had tried nortriptyline and gabapentin, she reports both of these medications have worsened the pain.  She has been doing better with her vertigo, initial onset was about 1 year ago, lasted for 6 months.  She reports about 1 month ago, she had a recurrence of symptoms.  She reports onset of dizziness, when looking downward.  She reports dizziness occurs first, then followed by frontal headache.  She says when she gets moving around the feeling may subside.  She reports the active looking down, triggers the dizziness and headache.  She is taking 5 mg Flexeril at bedtime, it is  beneficial.  She reports some photophobia, nausea.  She denies numbness or weakness in her arms or legs.  She reports her symptoms are nearly identical, to her prior presentation.  She does not feel dizzy when standing.  She denies trouble ambulating or with her balance.  She denies numbness or weakness in her arms or legs.  Dizziness, headache does not occur constant, but it is nearly daily, triggered by looking downward.  She presents for follow-up unaccompanied.   REVIEW OF SYSTEMS: Out of a complete 14 system review of symptoms, the patient complains only of the following symptoms, and all other reviewed systems are negative.  Dizziness  ALLERGIES: No Known Allergies  HOME MEDICATIONS: Outpatient Medications Prior to Visit  Medication Sig Dispense Refill  . aspirin EC 81 MG tablet Take 81 mg by mouth daily.    Marland Kitchen atorvastatin (LIPITOR) 40 MG tablet Take 40 mg by mouth daily.    Marland Kitchen azelastine (ASTELIN) 0.1 % nasal spray Place into both nostrils 2 (two) times daily. Use in each nostril as directed    . budesonide (RHINOCORT AQUA) 32 MCG/ACT nasal spray Place 1 spray into both nostrils daily.    . cetirizine (ZYRTEC) 10 MG tablet Take 10 mg by mouth as needed.     . diazepam (VALIUM) 2 MG tablet Take 2 mg by mouth at bedtime.    Marland Kitchen esomeprazole (NEXIUM) 40 MG capsule Take 1 capsule (40 mg  total) by mouth 2 (two) times daily before a meal. Pt takes BRAND  Take before breakfast and dinner. 60 capsule 0  . levothyroxine (SYNTHROID) 100 MCG tablet Take 100 mcg by mouth daily before breakfast. Pt takes BRAND    . predniSONE (DELTASONE) 5 MG tablet Take 5 mg by mouth daily with breakfast.    . SERTRALINE HCL PO Take 200 mg by mouth daily.    Marland Kitchen topiramate (TOPAMAX) 25 MG tablet Start taking 1 tablet at bedtime for 3 days, then increase to 2 tablets at bedtime x 3 days, then increase to 3 tablets at bedtime 120 tablet 3  . cyclobenzaprine (FLEXERIL) 5 MG tablet Take 1 tablet (5 mg total) by mouth at  bedtime. (Patient not taking: Reported on 02/28/2019) 30 tablet 3  . montelukast (SINGULAIR) 10 MG tablet Take 10 mg by mouth at bedtime.    Marland Kitchen tiZANidine (ZANAFLEX) 4 MG capsule Take 4 mg by mouth at bedtime as needed for muscle spasms.     No facility-administered medications prior to visit.     PAST MEDICAL HISTORY: Past Medical History:  Diagnosis Date  . Anxiety   . Breast mass, right   . Esophageal stricture   . GERD (gastroesophageal reflux disease)   . Hiatal hernia   . Hyperlipidemia   . Hypertrophy of breast 01/2014  . Hypothyroidism   . TMJ syndrome   . Vertigo     PAST SURGICAL HISTORY: Past Surgical History:  Procedure Laterality Date  . Barbie Banner OSTEOTOMY Right 04/11/2014   Procedure: Barbie Banner OSTEOTOMY;  Surgeon: Marcheta Grammes, DPM;  Location: [facility];  Service: Podiatry;  Laterality: Right;  . BREAST BIOPSY    . BREAST LUMPECTOMY Right 07/29/2017   Procedure: RIGHT BREAST LUMPECTOMY;  Surgeon: Coralie Keens, MD;  Location: [facility];  Service: General;  Laterality: Right;  . BREAST REDUCTION SURGERY Bilateral 01/16/2014   Procedure: BILATERAL MAMMARY REDUCTION  (BREAST);  Surgeon: Charlene Brooke, MD;  Location: [place];  Service: Plastics;  Laterality: Bilateral;  . BUNIONECTOMY Right 04/11/2014   Procedure: [place];  Surgeon: Marcheta Grammes, DPM;  Location: [facility];  Service: Podiatry;  Laterality: Right;  . BUNIONECTOMY Right 12/25/2015   Procedure: REVISION Altamese [place];  Surgeon: Caprice Beaver, DPM;  Location: [facility];  Service: Podiatry;  Laterality: Right;  . COLONOSCOPY    . FLEXOR TENOTOMY Right 04/11/2014   Procedure: PERCUTANEOUS FLEXOR TENOTOMY 3RD DIGIT;  Surgeon: Marcheta Grammes, DPM;  Location: [facility];  Service: Podiatry;  Laterality: Right;  . HALLUX VALGUS BASE WEDGE Right 12/25/2015   Procedure: CLOSING BASE WEDGE OSTEOTOMY;  Surgeon: Caprice Beaver, DPM;  Location:  [facility];  Service: Podiatry;  Laterality: Right;  . mohs procedure Left 12/2016   2 areas of skin cancer on left leg   . WEIL OSTEOTOMY Right 04/11/2014   Procedure: WEIL OSTEOTOMY 2ND METATARSAL RIGHT FOOT;  Surgeon: Marcheta Grammes, DPM;  Location: [facility];  Service: Podiatry;  Laterality: Right;    FAMILY HISTORY: Family History  Problem Relation Age of Onset  . Stroke Mother   . Heart disease Father   . Other Son 21       suicide  . Colon cancer Maternal Grandfather   . Throat cancer Neg Hx   . Stomach cancer Neg Hx   . Rectal cancer Neg Hx   . Esophageal cancer Neg Hx     SOCIAL HISTORY: Social History   Socioeconomic History  .  Marital status: Married    Spouse name: Not on file  . Number of children: 3  . Years of education: Not on file  . Highest education level: Not on file  Occupational History  . Occupation: homemaker  Social Needs  . Financial resource strain: Not on file  . Food insecurity    Worry: Not on file    Inability: Not on file  . Transportation needs    Medical: Not on file    Non-medical: Not on file  Tobacco Use  . Smoking status: Never Smoker  . Smokeless tobacco: Never Used  Substance and Sexual Activity  . Alcohol use: Yes    Comment: occasionally  . Drug use: No  . Sexual activity: Yes    Birth control/protection: Post-menopausal  Lifestyle  . Physical activity    Days per week: Not on file    Minutes per session: Not on file  . Stress: Not on file  Relationships  . Social Herbalist on phone: Not on file    Gets together: Not on file    Attends religious service: Not on file    Active member of club or organization: Not on file    Attends meetings of clubs or organizations: Not on file    Relationship status: Not on file  . Intimate partner violence    Fear of current or ex partner: Not on file    Emotionally abused: Not on file    Physically abused: Not on file    Forced sexual activity: Not on file   Other Topics Concern  . Not on file  Social History Narrative   Lives   Caffeine use    PHYSICAL EXAM  Vitals:   02/28/19 1515  BP: 116/74  Pulse: 78  Temp: (!) 97.2 F (36.2 C)  TempSrc: Oral  Weight: 185 lb 6.4 oz (84.1 kg)  Height: 5' 5.5" (1.664 m)   Body mass index is 30.38 kg/m.  Generalized: Well developed, in no acute distress   Neurological examination  Mentation: Alert oriented to time, place, history taking. Follows all commands speech and language fluent Cranial nerve II-XII: Pupils were equal round reactive to light. Extraocular movements were full, visual field were full on confrontational test. Facial sensation and strength were normal.  Head turning and shoulder shrug  were normal and symmetric. Motor: The motor testing reveals 5 over 5 strength of all 4 extremities. Good symmetric motor tone is noted throughout.  Sensory: Sensory testing is intact to soft touch on all 4 extremities. No evidence of extinction is noted.  Coordination: Cerebellar testing reveals good finger-nose-finger and heel-to-shin bilaterally.  Gait and station: Gait is normal. Tandem gait is normal. Romberg is negative, but tends to lean forward.  Reflexes: Deep tendon reflexes are symmetric and normal bilaterally.   DIAGNOSTIC DATA (LABS, IMAGING, TESTING) - I reviewed patient records, labs, notes, testing and imaging myself where available.  Lab Results  Component Value Date   WBC 5.9 12/23/2015   HGB 13.0 12/23/2015   HCT 40.9 12/23/2015   MCV 91.7 12/23/2015   PLT 200 12/23/2015      Component Value Date/Time   NA 144 09/28/2017 0907   K 4.3 09/28/2017 0907   CL 102 09/28/2017 0907   CO2 26 09/28/2017 0907   GLUCOSE 101 (H) 09/28/2017 0907   GLUCOSE 79 12/23/2015 1424   BUN 17 09/28/2017 0907   CREATININE 0.91 09/28/2017 0907   CALCIUM 9.3 09/28/2017 0907  PROT 6.7 09/28/2017 0907   ALBUMIN 4.5 09/28/2017 0907   AST 19 09/28/2017 0907   ALT 27 09/28/2017 0907    ALKPHOS 66 09/28/2017 0907   BILITOT 0.2 09/28/2017 0907   GFRNONAA 69 09/28/2017 0907   GFRAA 79 09/28/2017 0907   No results found for: CHOL, HDL, LDLCALC, LDLDIRECT, TRIG, CHOLHDL No results found for: HGBA1C No results found for: VITAMINB12 No results found for: TSH  ASSESSMENT AND PLAN 63 y.o. year old female  has a past medical history of Anxiety, Breast mass, right, Esophageal stricture, GERD (gastroesophageal reflux disease), Hiatal hernia, Hyperlipidemia, Hypertrophy of breast (01/2014), Hypothyroidism, TMJ syndrome, and Vertigo. here with:  1.  Headache 2.  Dizziness  She indicates she is no longer having headaches.  She does not feel Topamax has been beneficial and would like to stop the medication.  She will slowly wean off her 75 mg bedtime dose.  Her dizziness is prompted by lying flat on the floor, turning over in bed, or sitting and looking upward.  She has tried gabapentin, nortriptyline, Flexeril, Valium.  She has found Flexeril to be most helpful.  She will stop Valium, increase Flexeril to 10 mg at bedtime.  I will obtain the report for carotid Doppler ultrasound she had done in 2019 at her cardiologist at [facility].  She is already in physical therapy for her back.  We may consider vestibular rehab for her.  ENT has been unable to determine etiology for symptoms.  She will discuss with her therapist, and let me know if she would like to proceed with vestibular rehab.  MRI of the brain in June 2019 was completely normal.  She will follow-up in 6 months or sooner if needed.  I did advise if her symptoms worsen or she develops any new symptoms she should let us know. In the future, we may consider a dose reduction of her Zoloft dose which is 200 mg daily.   I spent 15 minutes with the patient. 50% of this time was spent discussing her plan of care.  Butler Denmark, AGNP-C, DNP 02/28/2019, 3:31 PM [facility] [address] [place], [place] [address]  [phone]

## 2019-02-28 ENCOUNTER — Encounter: Payer: Self-pay | Admitting: Neurology

## 2019-02-28 ENCOUNTER — Ambulatory Visit (INDEPENDENT_AMBULATORY_CARE_PROVIDER_SITE_OTHER): Payer: BC Managed Care – PPO | Admitting: Neurology

## 2019-02-28 ENCOUNTER — Other Ambulatory Visit: Payer: Self-pay

## 2019-02-28 ENCOUNTER — Telehealth: Payer: Self-pay

## 2019-02-28 VITALS — BP 116/74 | HR 78 | Temp 97.2°F | Ht 65.5 in | Wt 185.4 lb

## 2019-02-28 DIAGNOSIS — G4489 Other headache syndrome: Secondary | ICD-10-CM | POA: Diagnosis not present

## 2019-02-28 MED ORDER — CYCLOBENZAPRINE HCL 5 MG PO TABS: 10.0000 mg | ORAL_TABLET | Freq: Every day | ORAL | 3 refills | Status: DC

## 2019-02-28 NOTE — Progress Notes (Signed)
I have read the note, and I agree with the clinical assessment and plan.  Charles K Willis   

## 2019-02-28 NOTE — Patient Instructions (Signed)
1. Stop Topamax, since you don't think it has been helpful  2. Increase Flexeril 10 mg at bedtime, stop Valium  3. I will get the Carotid Doppler Report from your cardiologist  4. We can consider vestibular rehab for your dizziness

## 2019-02-28 NOTE — Telephone Encounter (Signed)
Called the pts cardiologist office to obtain records.  Requesting.Marland Kitchen "Carotid Doppler Ultrasound"  No answer left a Voice mail.  Could not find practice Fax #. [facility]  Dr. Cleora Fleet. [phone]

## 2019-03-01 ENCOUNTER — Telehealth: Payer: Self-pay | Admitting: Neurology

## 2019-03-01 NOTE — Telephone Encounter (Signed)
Was able to get in touch with an operator from the Fax Number.  [phone]  Faxed over the request.

## 2019-03-01 NOTE — Telephone Encounter (Signed)
I received the carotid artery ultrasound that was done 09/27/2017 by her cardiologist.  There is bilateral intimal thickening and no definite plaques.  Peak systolic flow velocities at the sites correspond to stenosis less than 50%.  Less than 50% stenosis left and right internal carotid arteries.

## 2019-03-02 ENCOUNTER — Telehealth: Payer: Self-pay | Admitting: Neurology

## 2019-03-02 DIAGNOSIS — G4489 Other headache syndrome: Secondary | ICD-10-CM

## 2019-03-02 NOTE — Telephone Encounter (Signed)
Pt called in and stated a referral was suppose to be sent for Physical Therapy for her Vertigo [facility]  Phone # [phone] Fax # [phone]

## 2019-08-29 ENCOUNTER — Ambulatory Visit: Payer: BC Managed Care – PPO | Admitting: Neurology

## 2019-09-25 ENCOUNTER — Ambulatory Visit: Payer: BC Managed Care – PPO | Admitting: Neurology

## 2019-10-23 ENCOUNTER — Telehealth: Payer: Self-pay

## 2019-10-23 NOTE — Telephone Encounter (Signed)
Prior Authorization sent to the office and submitted through Cover My [place]  PA Case ID: [id number] Outcome:Approved today PA Case: [id number], Status: Approved, Coverage Starts on: 10/23/2019 12:00:00 AM, Coverage Ends on: 10/22/2020 12:00:00 AM. Drug NEXIUM 40 MG dr capsules Investment banker, operational PA Form [phone] NCPDP)

## 2019-11-23 ENCOUNTER — Ambulatory Visit: Payer: BC Managed Care – PPO | Admitting: Neurology

## 2019-11-29 DIAGNOSIS — Z961 Presence of intraocular lens: Secondary | ICD-10-CM | POA: Insufficient documentation

## 2019-12-19 ENCOUNTER — Other Ambulatory Visit: Payer: Self-pay | Admitting: Specialist

## 2019-12-19 DIAGNOSIS — Z1231 Encounter for screening mammogram for malignant neoplasm of breast: Secondary | ICD-10-CM

## 2020-01-24 ENCOUNTER — Encounter: Payer: Self-pay | Admitting: Neurology

## 2020-01-24 ENCOUNTER — Ambulatory Visit
Admission: RE | Admit: 2020-01-24 | Discharge: 2020-01-24 | Disposition: A | Discharge status: Home / Self Care | Payer: BC Managed Care – PPO | Source: Ambulatory Visit | Attending: Specialist | Admitting: Specialist

## 2020-01-24 ENCOUNTER — Ambulatory Visit (INDEPENDENT_AMBULATORY_CARE_PROVIDER_SITE_OTHER): Payer: BC Managed Care – PPO | Admitting: Neurology

## 2020-01-24 ENCOUNTER — Other Ambulatory Visit: Payer: Self-pay

## 2020-01-24 VITALS — BP 108/49 | HR 63 | Ht 65.0 in | Wt 163.0 lb

## 2020-01-24 DIAGNOSIS — R42 Dizziness and giddiness: Secondary | ICD-10-CM | POA: Diagnosis not present

## 2020-01-24 DIAGNOSIS — F5104 Psychophysiologic insomnia: Secondary | ICD-10-CM

## 2020-01-24 DIAGNOSIS — Z1231 Encounter for screening mammogram for malignant neoplasm of breast: Secondary | ICD-10-CM

## 2020-01-24 DIAGNOSIS — G4489 Other headache syndrome: Secondary | ICD-10-CM | POA: Diagnosis not present

## 2020-01-24 HISTORY — DX: Psychophysiologic insomnia: F51.04

## 2020-01-24 MED ORDER — TRAZODONE HCL 50 MG PO TABS: 100.0000 mg | ORAL_TABLET | Freq: Every day | ORAL | 3 refills | Status: DC

## 2020-01-24 NOTE — Progress Notes (Signed)
Reason for visit: Vertigo, headache  Jessica [name] is an 64 y.o. female  History of present illness:  Jessica [name] is a 64 year old right-handed white female with a history of vertigo that began in March 2019.  The patient has continued to have some problems with vertigo, she has been seen through ENT previously.  She has some popping of the ears when she tries to swallow, her ENT physician thought that she may have some eustachian tube dysfunction but did not wish to put in myringotomy tubes.  The patient has not had much trouble with headaches at this point.  She takes Flexeril 10 mg at night, she has had some problems with chronic insomnia over the last 6 months.  She does the Epley maneuvers frequently to help ameliorate the symptoms of the vertigo.  She is seen through another physician for chronic back pain and leg discomfort, she has right hip pain will be having MRI of the right hip in the near future.  The patient returns to the office today for further evaluation.  Past Medical History:  Diagnosis Date  . Anxiety   . Breast mass, right   . Esophageal stricture   . GERD (gastroesophageal reflux disease)   . Hiatal hernia   . Hyperlipidemia   . Hypertrophy of breast 01/2014  . Hypothyroidism   . TMJ syndrome   . Vertigo     Past Surgical History:  Procedure Laterality Date  . Barbie Banner OSTEOTOMY Right 04/11/2014   Procedure: Barbie Banner OSTEOTOMY;  Surgeon: Marcheta Grammes, DPM;  Location: [facility];  Service: Podiatry;  Laterality: Right;  . BREAST BIOPSY    . BREAST LUMPECTOMY Right 07/29/2017   Procedure: RIGHT BREAST LUMPECTOMY;  Surgeon: Coralie Keens, MD;  Location: [facility];  Service: General;  Laterality: Right;  . BREAST REDUCTION SURGERY Bilateral 01/16/2014   Procedure: BILATERAL MAMMARY REDUCTION  (BREAST);  Surgeon: Charlene Brooke, MD;  Location: [facility];  Service: Plastics;  Laterality: Bilateral;  . BUNIONECTOMY  Right 04/11/2014   Procedure: Altamese [place];  Surgeon: Marcheta Grammes, DPM;  Location: [facility];  Service: Podiatry;  Laterality: Right;  . BUNIONECTOMY Right 12/25/2015   Procedure: REVISION Altamese [place];  Surgeon: Caprice Beaver, DPM;  Location: [facility];  Service: Podiatry;  Laterality: Right;  . CATARACT EXTRACTION, BILATERAL    . COLONOSCOPY    . FLEXOR TENOTOMY Right 04/11/2014   Procedure: PERCUTANEOUS FLEXOR TENOTOMY 3RD DIGIT;  Surgeon: Marcheta Grammes, DPM;  Location: [facility];  Service: Podiatry;  Laterality: Right;  . HALLUX VALGUS BASE WEDGE Right 12/25/2015   Procedure: CLOSING BASE WEDGE OSTEOTOMY;  Surgeon: Caprice Beaver, DPM;  Location: [facility];  Service: Podiatry;  Laterality: Right;  . mohs procedure Left 12/2016   2 areas of skin cancer on left leg   . WEIL OSTEOTOMY Right 04/11/2014   Procedure: WEIL OSTEOTOMY 2ND METATARSAL RIGHT FOOT;  Surgeon: Marcheta Grammes, DPM;  Location: [facility];  Service: Podiatry;  Laterality: Right;    Family History  Problem Relation Age of Onset  . Stroke Mother   . Heart disease Father   . Other Son 21       suicide  . Colon cancer Maternal Grandfather   . Throat cancer Neg Hx   . Stomach cancer Neg Hx   . Rectal cancer Neg Hx   . Esophageal cancer Neg Hx     Social history:  reports that she has never smoked. She  has never used smokeless tobacco. She reports current alcohol use. She reports that she does not use drugs.   No Known Allergies  Medications:  Prior to Admission medications   Medication Sig Start Date End Date Taking? Authorizing Provider  aspirin EC 81 MG tablet Take 81 mg by mouth daily.   Yes [provider]  atorvastatin (LIPITOR) 40 MG tablet Take 40 mg by mouth daily.   Yes [provider]  azelastine (ASTELIN) 0.1 % nasal spray Place into both nostrils 2 (two) times daily as needed. Use in each nostril as directed    Yes [provider]  budesonide  (RHINOCORT AQUA) 32 MCG/ACT nasal spray Place 1 spray into both nostrils daily.   Yes [provider]  cetirizine (ZYRTEC) 10 MG tablet Take 10 mg by mouth daily as needed.    Yes [provider]  cyclobenzaprine (FLEXERIL) 5 MG tablet Take 2 tablets (10 mg total) by mouth at bedtime. 02/28/19  Yes Suzzanne Cloud, NP  esomeprazole (NEXIUM) 40 MG capsule Take 1 capsule (40 mg total) by mouth 2 (two) times daily before a meal. Pt takes BRAND  Take before breakfast and dinner. 09/30/18  Yes Esterwood, Amy S, PA-C  levothyroxine (SYNTHROID) 100 MCG tablet Take 100 mcg by mouth daily before breakfast. Pt takes BRAND   Yes [provider]  metoprolol tartrate (LOPRESSOR) 50 MG tablet Take 50 mg by mouth 2 (two) times daily as needed (Takes in the morning, 2nd dose is PRN).   Yes [provider]  SERTRALINE HCL PO Take 200 mg by mouth daily.   Yes [provider]  diazepam (VALIUM) 2 MG tablet Take 2 mg by mouth at bedtime. 12/23/18   [provider]  predniSONE (DELTASONE) 5 MG tablet Take 5 mg by mouth daily with breakfast.    [provider]    ROS:  Out of a complete 14 system review of symptoms, the patient complains only of the following symptoms, and all other reviewed systems are negative.  Chronic back pain, bilateral leg pain Vertigo Insomnia  Blood pressure (!) 108/49, pulse 63, height 5\' 5"  (1.651 m), weight 163 lb (73.9 kg).  Physical Exam  General: The patient is alert and cooperative at the time of the examination.  Skin: No significant peripheral edema is noted.   Neurologic Exam  Mental status: The patient is alert and oriented x 3 at the time of the examination. The patient has apparent normal recent and remote memory, with an apparently normal attention span and concentration ability.   Cranial nerves: Facial symmetry is present. Speech is normal, no aphasia or dysarthria is noted. Extraocular movements are  full. Visual fields are full.  Motor: The patient has good strength in all 4 extremities.  Sensory examination: Soft touch sensation is symmetric on the face, arms, and legs.  Coordination: The patient has good finger-nose-finger and heel-to-shin bilaterally.  Gait and station: The patient has a normal gait. Tandem gait is normal. Romberg is negative. No drift is seen.  Reflexes: Deep tendon reflexes are symmetric.   Assessment/Plan:  1.  Positional vertigo  2.  History of headache, improved  3.  Chronic insomnia  4.  Chronic low back pain  The patient will be reduced on the Zoloft to 50 mg a day for 1 week and then stop, we will add trazodone for sleep taking 100 mg at night, she may go up as needed on the dose.  The prescription was sent  in.  The patient will continue the Flexeril for now.  The headaches are no longer a problem for her.  She will follow-up here in 6 months.  Jill Alexanders MD 01/24/2020 9:30 AM  [facility] [address] [place], [place] [address]  Phone [phone] Fax [phone]

## 2020-01-24 NOTE — Patient Instructions (Signed)
We will cut the zoloft to 50 mg at night for 1 week, then stop. We will start trazodone 100 mg at night and may increase if needed for sleep.

## 2020-07-29 ENCOUNTER — Ambulatory Visit: Payer: BC Managed Care – PPO | Admitting: Neurology

## 2020-10-31 ENCOUNTER — Ambulatory Visit: Payer: Self-pay | Admitting: Neurology

## 2021-01-15 ENCOUNTER — Other Ambulatory Visit: Payer: Self-pay | Admitting: Specialist

## 2021-01-15 DIAGNOSIS — Z1231 Encounter for screening mammogram for malignant neoplasm of breast: Secondary | ICD-10-CM

## 2021-01-28 ENCOUNTER — Ambulatory Visit: Payer: BC Managed Care – PPO

## 2021-02-17 ENCOUNTER — Ambulatory Visit: Payer: BC Managed Care – PPO

## 2021-02-19 ENCOUNTER — Other Ambulatory Visit: Payer: Self-pay

## 2021-02-19 ENCOUNTER — Ambulatory Visit
Admission: RE | Admit: 2021-02-19 | Discharge: 2021-02-19 | Disposition: A | Discharge status: Home / Self Care | Payer: 59 | Source: Ambulatory Visit | Attending: Specialist | Admitting: Specialist

## 2021-02-19 DIAGNOSIS — Z1231 Encounter for screening mammogram for malignant neoplasm of breast: Secondary | ICD-10-CM

## 2021-02-21 ENCOUNTER — Other Ambulatory Visit: Payer: Self-pay | Admitting: Specialist

## 2021-02-21 DIAGNOSIS — R928 Other abnormal and inconclusive findings on diagnostic imaging of breast: Secondary | ICD-10-CM

## 2021-03-20 ENCOUNTER — Other Ambulatory Visit: Payer: Self-pay | Admitting: Specialist

## 2021-03-20 ENCOUNTER — Ambulatory Visit
Admission: RE | Admit: 2021-03-20 | Discharge: 2021-03-20 | Disposition: A | Discharge status: Home / Self Care | Payer: 59 | Source: Ambulatory Visit | Attending: Specialist | Admitting: Specialist

## 2021-03-20 DIAGNOSIS — R928 Other abnormal and inconclusive findings on diagnostic imaging of breast: Secondary | ICD-10-CM

## 2021-03-25 ENCOUNTER — Other Ambulatory Visit: Payer: Self-pay | Admitting: Specialist

## 2021-03-25 DIAGNOSIS — R921 Mammographic calcification found on diagnostic imaging of breast: Secondary | ICD-10-CM

## 2021-04-03 ENCOUNTER — Ambulatory Visit
Admission: RE | Admit: 2021-04-03 | Discharge: 2021-04-03 | Disposition: A | Discharge status: Home / Self Care | Payer: 59 | Source: Ambulatory Visit | Attending: Specialist | Admitting: Specialist

## 2021-04-03 ENCOUNTER — Other Ambulatory Visit: Payer: Self-pay

## 2021-04-03 DIAGNOSIS — R921 Mammographic calcification found on diagnostic imaging of breast: Secondary | ICD-10-CM

## 2021-04-03 DIAGNOSIS — R928 Other abnormal and inconclusive findings on diagnostic imaging of breast: Secondary | ICD-10-CM

## 2021-04-03 HISTORY — PX: BREAST EXCISIONAL BIOPSY: SUR124

## 2021-05-08 ENCOUNTER — Ambulatory Visit: Payer: Self-pay | Admitting: Neurology

## 2021-10-06 ENCOUNTER — Other Ambulatory Visit: Payer: Self-pay | Admitting: *Deleted

## 2021-10-06 DIAGNOSIS — Z8249 Family history of ischemic heart disease and other diseases of the circulatory system: Secondary | ICD-10-CM

## 2021-11-04 NOTE — Progress Notes (Unsigned)
Patient: Jessica [name] Date of Birth: [birth year]  Reason for Visit: Follow up History from: Patient Primary Neurologist: Jannifer Franklin  ASSESSMENT AND PLAN 66 y.o. year old female   53.  Positional vertigo  2.  History of headache 3.  Bilateral knee pain 4.  Gait abnormality  -Encouraged to continue Epley maneuver, follow-up with ENT for recheck of ear popping/fullness; could consider vestibular rehab if needed; headaches have not been an issue -I think her recent knee injuries are contributing to her reported gait instability, continue to monitor -Continue close follow-up with PCP return here on an as-needed basis  HISTORY OF PRESENT ILLNESS: Today 11/05/21 Jessica [name] is here for follow-up.  Has not been seen since October 2021. Had left knee surgery for torn meniscus from pickle ball, now trouble with right knee, pending MRI. Feels is walking forward, balance is affected. Denies any issues with headaches. Vertigo comes and goes, when turning over in bed triggers vertigo, started doing Epley again, with good benefit. When she swallows her ears pop, chronic, has seen ENT in the past, fullness in ears. Is helping her daughter who just had a baby, caring for her 73 year old mother. Getting results of MRI right knee today. Has noted her thumbs feel like quivering occasionally. Worries, she has a friend with [employer].  HISTORY  Update 01/24/2020 Dr. Jannifer Franklin: Jessica [name] is a 66 year old right-handed white female with a history of vertigo that began in March 2019.  The patient has continued to have some problems with vertigo, she has been seen through ENT previously.  She has some popping of the ears when she tries to swallow, her ENT physician thought that she may have some eustachian tube dysfunction but did not wish to put in myringotomy tubes.  The patient has not had much trouble with headaches at this point.  She takes Flexeril 10 mg at night, she has had some problems with chronic insomnia  over the last 6 months.  She does the Epley maneuvers frequently to help ameliorate the symptoms of the vertigo.  She is seen through another physician for chronic back pain and leg discomfort, she has right hip pain will be having MRI of the right hip in the near future.  The patient returns to the office today for further evaluation.  REVIEW OF SYSTEMS: Out of a complete 14 system review of symptoms, the patient complains only of the following symptoms, and all other reviewed systems are negative.  See HPI  ALLERGIES: No Known Allergies  HOME MEDICATIONS: Outpatient Medications Prior to Visit  Medication Sig Dispense Refill   ALPRAZolam (XANAX) 0.25 MG tablet Take 0.25 mg by mouth daily as needed.     aspirin EC 81 MG tablet Take 81 mg by mouth daily.     atorvastatin (LIPITOR) 40 MG tablet Take 40 mg by mouth daily.     celecoxib (CELEBREX) 200 MG capsule Take 1 capsule by mouth 2 (two) times daily.     cetirizine (ZYRTEC) 10 MG tablet Take 10 mg by mouth daily as needed.      co-enzyme Q-10 30 MG capsule Take 200 mg by mouth daily.     doxycycline (VIBRAMYCIN) 50 MG capsule Take 50 mg by mouth daily.     esomeprazole (NEXIUM) 40 MG capsule Take 1 capsule (40 mg total) by mouth 2 (two) times daily before a meal. Pt takes BRAND  Take before breakfast and dinner. 60 capsule 0   levothyroxine (SYNTHROID) 100 MCG tablet Take  100 mcg by mouth daily before breakfast. Pt takes BRAND     loratadine-pseudoephedrine (CLARITIN-D 24-HOUR) 10-240 MG 24 hr tablet Take 1 tablet by mouth daily.     metoprolol tartrate (LOPRESSOR) 50 MG tablet Take 50 mg by mouth 2 (two) times daily as needed (Takes in the morning, 2nd dose is PRN).     Multiple Vitamin (MULTIVITAMIN) tablet Take 1 tablet by mouth daily.     Niacinamide-Zn-Cu-Methfo-Se-Cr (NICOTINAMIDE PO) Take 500 mg by mouth daily.     SERTRALINE HCL PO Take 100 mg by mouth daily.     traZODone (DESYREL) 50 MG tablet Take 2 tablets (100 mg total) by  mouth at bedtime. 60 tablet 3   Turmeric (QC TUMERIC COMPLEX PO) Take 900 mg by mouth daily.     azelastine (ASTELIN) 0.1 % nasal spray Place into both nostrils 2 (two) times daily as needed. Use in each nostril as directed      budesonide (RHINOCORT AQUA) 32 MCG/ACT nasal spray Place 1 spray into both nostrils daily.     cyclobenzaprine (FLEXERIL) 5 MG tablet Take 2 tablets (10 mg total) by mouth at bedtime. 60 tablet 3   No facility-administered medications prior to visit.    PAST MEDICAL HISTORY: Past Medical History:  Diagnosis Date   Anxiety    Breast mass, right    Chronic insomnia 01/24/2020   Esophageal stricture    GERD (gastroesophageal reflux disease)    Hiatal hernia    Hyperlipidemia    Hypertrophy of breast 01/2014   Hypothyroidism    TMJ syndrome    Vertigo     PAST SURGICAL HISTORY: Past Surgical History:  Procedure Laterality Date   Barbie Banner OSTEOTOMY Right 04/11/2014   Procedure: Barbie Banner OSTEOTOMY;  Surgeon: Marcheta Grammes, DPM;  Location: [facility];  Service: Podiatry;  Laterality: Right;   BREAST BIOPSY     BREAST LUMPECTOMY Right 07/29/2017   Procedure: RIGHT BREAST LUMPECTOMY;  Surgeon: Coralie Keens, MD;  Location: [facility];  Service: General;  Laterality: Right;   BREAST REDUCTION SURGERY Bilateral 01/16/2014   Procedure: BILATERAL MAMMARY REDUCTION  (BREAST);  Surgeon: Charlene Brooke, MD;  Location: [facility];  Service: Plastics;  Laterality: Bilateral;   BUNIONECTOMY Right 04/11/2014   Procedure: Altamese [place];  Surgeon: Marcheta Grammes, DPM;  Location: [facility];  Service: Podiatry;  Laterality: Right;   BUNIONECTOMY Right 12/25/2015   Procedure: REVISION Altamese [place];  Surgeon: Caprice Beaver, DPM;  Location: [facility];  Service: Podiatry;  Laterality: Right;   CATARACT EXTRACTION, BILATERAL     COLONOSCOPY     FLEXOR TENOTOMY  Right 04/11/2014   Procedure: PERCUTANEOUS FLEXOR TENOTOMY 3RD  DIGIT;  Surgeon: Marcheta Grammes, DPM;  Location: [facility];  Service: Podiatry;  Laterality: Right;   HALLUX VALGUS BASE WEDGE Right 12/25/2015   Procedure: CLOSING BASE WEDGE OSTEOTOMY;  Surgeon: Caprice Beaver, DPM;  Location: [facility];  Service: Podiatry;  Laterality: Right;   mohs procedure Left 12/2016   2 areas of skin cancer on left leg    WEIL OSTEOTOMY Right 04/11/2014   Procedure: WEIL OSTEOTOMY 2ND METATARSAL RIGHT FOOT;  Surgeon: Marcheta Grammes, DPM;  Location: [facility];  Service: Podiatry;  Laterality: Right;    FAMILY HISTORY: Family History  Problem Relation Age of Onset   Stroke Mother    Heart disease Father    Other Son 42       suicide   Colon cancer Maternal Grandfather  Throat cancer Neg Hx    Stomach cancer Neg Hx    Rectal cancer Neg Hx    Esophageal cancer Neg Hx     SOCIAL HISTORY: Social History   Socioeconomic History   Marital status: Married    Spouse name: Richardson Landry   Number of children: 3   Years of education: Not on file   Highest education level: Not on file  Occupational History   Occupation: homemaker  Tobacco Use   Smoking status: Never   Smokeless tobacco: Never  Vaping Use   Vaping Use: Never used  Substance and Sexual Activity   Alcohol use: Yes    Comment: occasionally   Drug use: No   Sexual activity: Yes    Birth control/protection: Post-menopausal  Other Topics Concern   Not on file  Social History Narrative   Lives with spouse only   Drinks 1-2 cups of caffeine   Right Handed   Social Determinants of Health   Financial Resource Strain: Not on file  Food Insecurity: Not on file  Transportation Needs: Not on file  Physical Activity: Not on file  Stress: Not on file  Social Connections: Not on file  Intimate Partner Violence: Not on file   PHYSICAL EXAM  Vitals:   11/05/21 1318  BP: 124/69  Pulse: (!) 52  Weight: 192 lb (87.1 kg)  Height: '5\' 5"'$  (1.651 m)   Body mass index is 31.95  kg/m.  Generalized: Well developed, in no acute distress  Neurological examination  Mentation: Alert oriented to time, place, history taking. Follows all commands speech and language fluent Cranial nerve II-XII: Pupils were equal round reactive to light. Extraocular movements were full, visual field were full on confrontational test. Facial sensation and strength were normal.  Head turning and shoulder shrug were normal and symmetric. Motor: The motor testing reveals 5 over 5 strength of all 4 extremities. Good symmetric motor tone is noted throughout.  Sensory: Sensory testing is intact to soft touch on all 4 extremities. No evidence of extinction is noted.  Coordination: Cerebellar testing reveals good finger-nose-finger and heel-to-shin bilaterally.  No tremor noted. Gait and station: Gait is normal, slight limp on the right, swelling to the right knee Reflexes: Deep tendon reflexes are symmetric and normal bilaterally.   DIAGNOSTIC DATA (LABS, IMAGING, TESTING) - I reviewed patient records, labs, notes, testing and imaging myself where available.  Lab Results  Component Value Date   WBC 5.9 12/23/2015   HGB 13.0 12/23/2015   HCT 40.9 12/23/2015   MCV 91.7 12/23/2015   PLT 200 12/23/2015      Component Value Date/Time   NA 144 09/28/2017 0907   K 4.3 09/28/2017 0907   CL 102 09/28/2017 0907   CO2 26 09/28/2017 0907   GLUCOSE 101 (H) 09/28/2017 0907   GLUCOSE 79 12/23/2015 1424   BUN 17 09/28/2017 0907   CREATININE 0.91 09/28/2017 0907   CALCIUM 9.3 09/28/2017 0907   PROT 6.7 09/28/2017 0907   ALBUMIN 4.5 09/28/2017 0907   AST 19 09/28/2017 0907   ALT 27 09/28/2017 0907   ALKPHOS 66 09/28/2017 0907   BILITOT 0.2 09/28/2017 0907   GFRNONAA 69 09/28/2017 0907   GFRAA 79 09/28/2017 0907   No results found for: "CHOL", "HDL", "LDLCALC", "LDLDIRECT", "TRIG", "CHOLHDL" No results found for: "HGBA1C" No results found for: "VITAMINB12" No results found for: "TSH"  Butler Denmark, AGNP-C, DNP 11/05/2021, 3:11 PM [facility] [address], [place], [place] [address] [phone]  [phone]   

## 2021-11-05 ENCOUNTER — Ambulatory Visit (INDEPENDENT_AMBULATORY_CARE_PROVIDER_SITE_OTHER): Payer: Medicare Other | Admitting: Neurology

## 2021-11-05 ENCOUNTER — Encounter: Payer: Self-pay | Admitting: Neurology

## 2021-11-05 VITALS — BP 124/69 | HR 52 | Ht 65.0 in | Wt 192.0 lb

## 2021-11-05 DIAGNOSIS — G4489 Other headache syndrome: Secondary | ICD-10-CM | POA: Diagnosis not present

## 2021-11-05 DIAGNOSIS — R42 Dizziness and giddiness: Secondary | ICD-10-CM | POA: Diagnosis not present

## 2021-11-05 NOTE — Patient Instructions (Signed)
Closely monitor your symptoms, follow-up with ENT, please feel free to reach out if you feel like anything is worsening

## 2022-02-09 ENCOUNTER — Other Ambulatory Visit: Payer: Self-pay

## 2022-02-09 ENCOUNTER — Other Ambulatory Visit: Payer: Self-pay | Admitting: Specialist

## 2022-02-09 DIAGNOSIS — Z1231 Encounter for screening mammogram for malignant neoplasm of breast: Secondary | ICD-10-CM

## 2022-03-10 ENCOUNTER — Ambulatory Visit
Admission: RE | Admit: 2022-03-10 | Discharge: 2022-03-10 | Disposition: A | Discharge status: Home / Self Care | Payer: Medicare Other | Source: Ambulatory Visit | Attending: Specialist | Admitting: Specialist

## 2022-03-10 DIAGNOSIS — Z1231 Encounter for screening mammogram for malignant neoplasm of breast: Secondary | ICD-10-CM

## 2022-08-21 ENCOUNTER — Emergency Department (HOSPITAL_COMMUNITY): Payer: Medicare Other

## 2022-08-21 ENCOUNTER — Encounter (HOSPITAL_COMMUNITY): Payer: Self-pay | Admitting: Emergency Medicine

## 2022-08-21 ENCOUNTER — Other Ambulatory Visit: Payer: Self-pay

## 2022-08-21 ENCOUNTER — Observation Stay (HOSPITAL_COMMUNITY)
Admission: EM | Admit: 2022-08-21 | Discharge: 2022-08-22 | Disposition: A | Discharge status: Home / Self Care | Payer: Medicare Other | Attending: Internal Medicine | Admitting: Internal Medicine

## 2022-08-21 DIAGNOSIS — J206 Acute bronchitis due to rhinovirus: Secondary | ICD-10-CM

## 2022-08-21 DIAGNOSIS — Z7982 Long term (current) use of aspirin: Secondary | ICD-10-CM | POA: Diagnosis not present

## 2022-08-21 DIAGNOSIS — F419 Anxiety disorder, unspecified: Secondary | ICD-10-CM | POA: Insufficient documentation

## 2022-08-21 DIAGNOSIS — B348 Other viral infections of unspecified site: Secondary | ICD-10-CM | POA: Diagnosis not present

## 2022-08-21 DIAGNOSIS — Z79899 Other long term (current) drug therapy: Secondary | ICD-10-CM | POA: Diagnosis not present

## 2022-08-21 DIAGNOSIS — I200 Unstable angina: Secondary | ICD-10-CM | POA: Diagnosis not present

## 2022-08-21 DIAGNOSIS — E039 Hypothyroidism, unspecified: Secondary | ICD-10-CM | POA: Insufficient documentation

## 2022-08-21 DIAGNOSIS — R079 Chest pain, unspecified: Principal | ICD-10-CM | POA: Insufficient documentation

## 2022-08-21 DIAGNOSIS — R002 Palpitations: Secondary | ICD-10-CM | POA: Insufficient documentation

## 2022-08-21 DIAGNOSIS — Z1152 Encounter for screening for COVID-19: Secondary | ICD-10-CM | POA: Insufficient documentation

## 2022-08-21 DIAGNOSIS — K219 Gastro-esophageal reflux disease without esophagitis: Secondary | ICD-10-CM | POA: Insufficient documentation

## 2022-08-21 DIAGNOSIS — E876 Hypokalemia: Secondary | ICD-10-CM | POA: Diagnosis not present

## 2022-08-21 LAB — TROPONIN I (HIGH SENSITIVITY)
Troponin I (High Sensitivity): 3 ng/L (ref <18)
Troponin I (High Sensitivity): 3 ng/L (ref <18)

## 2022-08-21 LAB — CBC
HCT: 39 % (ref 36.0–46.0)
Hemoglobin: 12.6 g/dL (ref 12.0–15.0)
MCH: 29.3 pg (ref 26.0–34.0)
MCHC: 32.3 g/dL (ref 30.0–36.0)
MCV: 90.7 fL (ref 80.0–100.0)
Platelets: 150 10*3/uL (ref 150–400)
RBC: 4.3 MIL/uL (ref 3.87–5.11)
RDW: 13.2 % (ref 11.5–15.5)
WBC: 7 10*3/uL (ref 4.0–10.5)
nRBC: 0 % (ref 0.0–0.2)

## 2022-08-21 LAB — BASIC METABOLIC PANEL
Anion gap: 9 (ref 5–15)
BUN: 16 mg/dL (ref 8–23)
CO2: 24 mmol/L (ref 22–32)
Calcium: 8.9 mg/dL (ref 8.9–10.3)
Chloride: 104 mmol/L (ref 98–111)
Creatinine, Ser: 0.76 mg/dL (ref 0.44–1.00)
GFR, Estimated: >60 mL/min (ref >60)
Glucose, Bld: 102 mg/dL — ABNORMAL HIGH (ref 70–99)
Potassium: 3.2 mmol/L — ABNORMAL LOW (ref 3.5–5.1)
Sodium: 137 mmol/L (ref 135–145)

## 2022-08-21 MED ORDER — ONDANSETRON 4 MG PO TBDP
4.0000 mg | ORAL_TABLET | Freq: Once | ORAL | Status: AC
  Administered 2022-08-21: 4 mg via ORAL
  Filled 2022-08-21: qty 1

## 2022-08-21 MED ORDER — POTASSIUM CHLORIDE 20 MEQ PO PACK
40.0000 meq | PACK | Freq: Once | ORAL | Status: AC
  Administered 2022-08-21: 40 meq via ORAL
  Filled 2022-08-21: qty 2

## 2022-08-21 MED ORDER — IOHEXOL 350 MG/ML SOLN
75.0000 mL | Freq: Once | INTRAVENOUS | Status: AC | PRN
  Administered 2022-08-21: 75 mL via INTRAVENOUS

## 2022-08-21 NOTE — ED Triage Notes (Addendum)
Pt via POV sent from [facility] due to abnormal EKG. She went for evaluation of central chest pain that started yesterday with associated SOB, nausea, dizziness, and some diarrhea. CP radiates to mid back. UC EKG shows ST deviation and moderate t-wave abnormality, possible anterolateral ischemia. CP "tightness" rated 5/10 and pt seems anxious. Hx palpitations and knee replacement surgery 2 months ago.

## 2022-08-21 NOTE — ED Provider Notes (Signed)
[facility]ERGENCY DEPARTMENT AT [facility] Provider Note   CSN: [id number] Arrival date & time: 08/21/22  1756     History {Add pertinent medical, surgical, social history, OB history to HPI:1} Chief Complaint  Patient presents with   Chest Pain    Jessica [name] is a 67 y.o. female.  Patient has history of hypothyroidism.  She has been having chest pain and some shortness of breath for couple days.  The history is provided by the patient and medical records. No language interpreter was used.  Chest Pain Pain location:  L chest Pain quality: aching   Pain radiates to:  Does not radiate Pain severity:  Mild Onset quality:  Gradual Timing:  Constant Progression:  Waxing and waning Chronicity:  New Context: not breathing   Relieved by:  Nothing Worsened by:  Nothing Ineffective treatments:  None tried Associated symptoms: no abdominal pain, no back pain, no cough, no fatigue and no headache        Home Medications Prior to Admission medications   Medication Sig Start Date End Date Taking? Authorizing Provider  ALPRAZolam (XANAX) 0.25 MG tablet Take 0.25 mg by mouth daily as needed. 10/08/21   [provider]  aspirin EC 81 MG tablet Take 81 mg by mouth daily.    [provider]  atorvastatin (LIPITOR) 40 MG tablet Take 40 mg by mouth daily.    [provider]  celecoxib (CELEBREX) 200 MG capsule Take 1 capsule by mouth 2 (two) times daily. 09/10/21   [provider]  cetirizine (ZYRTEC) 10 MG tablet Take 10 mg by mouth daily as needed.     [provider]  co-enzyme Q-10 30 MG capsule Take 200 mg by mouth daily.    [provider]  doxycycline (VIBRAMYCIN) 50 MG capsule Take 50 mg by mouth daily.    [provider]  esomeprazole (NEXIUM) 40 MG capsule Take 1 capsule (40 mg total) by mouth 2 (two) times daily before a meal. Pt takes BRAND  Take before breakfast and dinner. 09/30/18   Esterwood,  Amy S, PA-C  levothyroxine (SYNTHROID) 100 MCG tablet Take 100 mcg by mouth daily before breakfast. Pt takes BRAND    [provider]  loratadine-pseudoephedrine (CLARITIN-D 24-HOUR) 10-240 MG 24 hr tablet Take 1 tablet by mouth daily.    [provider]  metoprolol tartrate (LOPRESSOR) 50 MG tablet Take 50 mg by mouth 2 (two) times daily as needed (Takes in the morning, 2nd dose is PRN).    [provider]  Multiple Vitamin (MULTIVITAMIN) tablet Take 1 tablet by mouth daily.    [provider]  Niacinamide-Zn-Cu-Methfo-Se-Cr (NICOTINAMIDE PO) Take 500 mg by mouth daily.    [provider]  SERTRALINE HCL PO Take 100 mg by mouth daily.    [provider]  traZODone (DESYREL) 50 MG tablet Take 2 tablets (100 mg total) by mouth at bedtime. 01/24/20   York Spaniel, MD  Turmeric (QC TUMERIC COMPLEX PO) Take 900 mg by mouth daily.    [provider]      Allergies    Patient has no known allergies.    Review of Systems   Review of Systems  Constitutional:  Negative for appetite change and fatigue.  HENT:  Negative for congestion, ear discharge and sinus pressure.   Eyes:  Negative for discharge.  Respiratory:  Negative for cough.   Cardiovascular:  Positive for chest pain.  Gastrointestinal:  Negative for abdominal  pain and diarrhea.  Genitourinary:  Negative for frequency and hematuria.  Musculoskeletal:  Negative for back pain.  Skin:  Negative for rash.  Neurological:  Negative for seizures and headaches.  Psychiatric/Behavioral:  Negative for hallucinations.     Physical Exam Updated Vital Signs BP (!) 108/48   Pulse 67   Temp 98.5 F (36.9 C) (Oral)   Resp 15   Ht 5\' 5"  (1.651 m)   Wt 79.4 kg   SpO2 92%   BMI 29.12 kg/m  Physical Exam Vitals and nursing note reviewed.  Constitutional:      Appearance: She is well-developed.  HENT:     Head: Normocephalic.     Nose: Nose normal.  Eyes:     General: No  scleral icterus.    Conjunctiva/sclera: Conjunctivae normal.  Neck:     Thyroid: No thyromegaly.  Cardiovascular:     Rate and Rhythm: Normal rate and regular rhythm.     Heart sounds: No murmur heard.    No friction rub. No gallop.  Pulmonary:     Breath sounds: No stridor. No wheezing or rales.  Chest:     Chest wall: No tenderness.  Abdominal:     General: There is no distension.     Tenderness: There is no abdominal tenderness. There is no rebound.  Musculoskeletal:        General: Normal range of motion.     Cervical back: Neck supple.  Lymphadenopathy:     Cervical: No cervical adenopathy.  Skin:    Findings: No erythema or rash.  Neurological:     Mental Status: She is alert and oriented to person, place, and time.     Motor: No abnormal muscle tone.     Coordination: Coordination normal.  Psychiatric:        Behavior: Behavior normal.     ED Results / Procedures / Treatments   Labs (all labs ordered are listed, but only abnormal results are displayed) Labs Reviewed  BASIC METABOLIC PANEL - Abnormal; Notable for the following components:      Result Value   Potassium 3.2 (*)    Glucose, Bld 102 (*)    All other components within normal limits  CBC  TROPONIN I (HIGH SENSITIVITY)  TROPONIN I (HIGH SENSITIVITY)    EKG EKG Interpretation  Date/Time:  Friday Aug 21 2022 18:15:53 EDT Ventricular Rate:  87 PR Interval:  154 QRS Duration: 90 QT Interval:  324 QTC Calculation: 389 R Axis:   63 Text Interpretation: Normal sinus rhythm ST & T wave abnormality, consider inferior ischemia ST & T wave abnormality, consider anterolateral ischemia Abnormal ECG When compared with ECG of 23-Dec-2015 14:14, T wave inversion more evident in Inferior leads QT has shortened Confirmed by Bethann Berkshire [phone]) on 08/21/2022 9:00:49 PM  Radiology CT Angio Chest PE W and/or Wo Contrast  Result Date: 08/21/2022 CLINICAL DATA:  Central chest pain. EXAM: CT ANGIOGRAPHY CHEST  WITH CONTRAST TECHNIQUE: Multidetector CT imaging of the chest was performed using the standard protocol during bolus administration of intravenous contrast. Multiplanar CT image reconstructions and MIPs were obtained to evaluate the vascular anatomy. RADIATION DOSE REDUCTION: This exam was performed according to the departmental dose-optimization program which includes automated exposure control, adjustment of the mA and/or kV according to patient size and/or use of iterative reconstruction technique. CONTRAST:  75mL OMNIPAQUE IOHEXOL 350 MG/ML SOLN COMPARISON:  None Available. FINDINGS: Cardiovascular: The thoracic aorta is normal in appearance. Satisfactory opacification of the pulmonary arteries  to the segmental level. No evidence of pulmonary embolism. Normal heart size. No pericardial effusion. Mediastinum/Nodes: No enlarged mediastinal, hilar, or axillary lymph nodes. Thyroid gland, trachea, and esophagus demonstrate no significant findings. Lungs/Pleura: Mild linear atelectasis is seen within the right lower lobe. Mild right lower lobe peribronchial thickening is also noted. There is no evidence of a pleural effusion or pneumothorax. Upper Abdomen: No acute abnormality. Musculoskeletal: No chest wall abnormality. No acute or significant osseous findings. Review of the MIP images confirms the above findings. IMPRESSION: 1. No evidence of pulmonary embolism. 2. Mild right lower lobe atelectasis and peribronchial thickening which may represent mild bronchitis. Electronically Signed   By: Aram Candela M.D.   On: 08/21/2022 22:19   DG Chest 2 View  Result Date: 08/21/2022 CLINICAL DATA:  Chest pain EXAM: CHEST - 2 VIEW COMPARISON:  None Available. FINDINGS: No consolidation, pneumothorax or effusion. No edema. Normal cardiopericardial silhouette. Mild degenerative changes of the spine. Deformity of right-sided ribcage. Possibly related to remote trauma or other process IMPRESSION: No acute  cardiopulmonary disease Electronically Signed   By: Karen Kays M.D.   On: 08/21/2022 19:14    Procedures Procedures  {Document cardiac monitor, telemetry assessment procedure when appropriate:1}  Medications Ordered in ED Medications  potassium chloride (KLOR-CON) packet 40 mEq (has no administration in time range)  ondansetron (ZOFRAN-ODT) disintegrating tablet 4 mg (4 mg Oral Given 08/21/22 2022)  iohexol (OMNIPAQUE) 350 MG/ML injection 75 mL (75 mLs Intravenous Contrast Given 08/21/22 2151)    ED Course/ Medical Decision Making/ A&P  CRITICAL CARE Performed by: Bethann Berkshire Total critical care time: 45 minutes Critical care time was exclusive of separately billable procedures and treating other patients. Critical care was necessary to treat or prevent imminent or life-threatening deterioration. Critical care was time spent personally by me on the following activities: development of treatment plan with patient and/or surrogate as well as nursing, discussions with consultants, evaluation of patient's response to treatment, examination of patient, obtaining history from patient or surrogate, ordering and performing treatments and interventions, ordering and review of laboratory studies, ordering and review of radiographic studies, pulse oximetry and re-evaluation of patient's condition.  {Patient with chest pain and normal troponins but significant inverted T waves.  I spoke with Dr. Ardyth Harps cardiology and he recommended hospitalist admission to [facility] with cardiology consult Click here for ABCD2, HEART and other calculatorsREFRESH Note before signing :1}                          Medical Decision Making Amount and/or Complexity of Data Reviewed Labs: ordered. Radiology: ordered.  Risk Prescription drug management. Decision regarding hospitalization.   Chest pain and inverted T waves.  Patient will be admitted to [facility] and cardiology will consult  {Document critical  care time when appropriate:1} {Document review of labs and clinical decision tools ie heart score, Chads2Vasc2 etc:1}  {Document your independent review of radiology images, and any outside records:1} {Document your discussion with family members, caretakers, and with consultants:1} {Document social determinants of health affecting pt's care:1} {Document your decision making why or why not admission, treatments were needed:1} Final Clinical Impression(s) / ED Diagnoses Final diagnoses:  Unstable angina pectoris (HCC)    Rx / DC Orders ED Discharge Orders     None

## 2022-08-22 ENCOUNTER — Observation Stay (HOSPITAL_BASED_OUTPATIENT_CLINIC_OR_DEPARTMENT_OTHER): Payer: Medicare Other

## 2022-08-22 DIAGNOSIS — E039 Hypothyroidism, unspecified: Secondary | ICD-10-CM | POA: Insufficient documentation

## 2022-08-22 DIAGNOSIS — E876 Hypokalemia: Secondary | ICD-10-CM

## 2022-08-22 DIAGNOSIS — Z1152 Encounter for screening for COVID-19: Secondary | ICD-10-CM | POA: Diagnosis not present

## 2022-08-22 DIAGNOSIS — F419 Anxiety disorder, unspecified: Secondary | ICD-10-CM | POA: Diagnosis not present

## 2022-08-22 DIAGNOSIS — R0602 Shortness of breath: Secondary | ICD-10-CM

## 2022-08-22 DIAGNOSIS — B348 Other viral infections of unspecified site: Secondary | ICD-10-CM | POA: Diagnosis not present

## 2022-08-22 DIAGNOSIS — J206 Acute bronchitis due to rhinovirus: Secondary | ICD-10-CM

## 2022-08-22 DIAGNOSIS — Z79899 Other long term (current) drug therapy: Secondary | ICD-10-CM | POA: Diagnosis not present

## 2022-08-22 DIAGNOSIS — I200 Unstable angina: Secondary | ICD-10-CM | POA: Diagnosis not present

## 2022-08-22 DIAGNOSIS — R079 Chest pain, unspecified: Secondary | ICD-10-CM

## 2022-08-22 DIAGNOSIS — K219 Gastro-esophageal reflux disease without esophagitis: Secondary | ICD-10-CM | POA: Diagnosis not present

## 2022-08-22 DIAGNOSIS — R002 Palpitations: Secondary | ICD-10-CM

## 2022-08-22 DIAGNOSIS — Z7982 Long term (current) use of aspirin: Secondary | ICD-10-CM | POA: Diagnosis not present

## 2022-08-22 LAB — RESPIRATORY PANEL BY PCR

## 2022-08-22 LAB — COMPREHENSIVE METABOLIC PANEL
ALT: 20 U/L (ref 0–44)
AST: 23 U/L (ref 15–41)
Albumin: 3.2 g/dL — ABNORMAL LOW (ref 3.5–5.0)
Alkaline Phosphatase: 58 U/L (ref 38–126)
Anion gap: 9 (ref 5–15)
BUN: 12 mg/dL (ref 8–23)
CO2: 25 mmol/L (ref 22–32)
Calcium: 8.7 mg/dL — ABNORMAL LOW (ref 8.9–10.3)
Chloride: 107 mmol/L (ref 98–111)
Creatinine, Ser: 0.86 mg/dL (ref 0.44–1.00)
GFR, Estimated: >60 mL/min (ref >60)
Glucose, Bld: 106 mg/dL — ABNORMAL HIGH (ref 70–99)
Potassium: 3.7 mmol/L (ref 3.5–5.1)
Sodium: 141 mmol/L (ref 135–145)
Total Bilirubin: 0.6 mg/dL (ref 0.3–1.2)
Total Protein: 5.9 g/dL — ABNORMAL LOW (ref 6.5–8.1)

## 2022-08-22 LAB — CBC
HCT: 36.4 % (ref 36.0–46.0)
Hemoglobin: 11.7 g/dL — ABNORMAL LOW (ref 12.0–15.0)
MCH: 28.7 pg (ref 26.0–34.0)
MCHC: 32.1 g/dL (ref 30.0–36.0)
MCV: 89.2 fL (ref 80.0–100.0)
Platelets: 147 10*3/uL — ABNORMAL LOW (ref 150–400)
RBC: 4.08 MIL/uL (ref 3.87–5.11)
RDW: 13.2 % (ref 11.5–15.5)
WBC: 7.1 10*3/uL (ref 4.0–10.5)
nRBC: 0 % (ref 0.0–0.2)

## 2022-08-22 LAB — TROPONIN I (HIGH SENSITIVITY): Troponin I (High Sensitivity): 5 ng/L (ref <18)

## 2022-08-22 LAB — ECHOCARDIOGRAM COMPLETE
AR max vel: 2.01 cm2
AV Area VTI: 1.89 cm2
AV Area mean vel: 1.81 cm2
AV Mean grad: 7 mmHg
AV Peak grad: 12.7 mmHg
Ao pk vel: 1.78 m/s
Area-P 1/2: 3.91 cm2
Height: 65 in
S' Lateral: 3.3 cm
Weight: 3008 oz

## 2022-08-22 LAB — HIV ANTIBODY (ROUTINE TESTING W REFLEX): HIV Screen 4th Generation wRfx: NONREACTIVE

## 2022-08-22 LAB — TSH: TSH: 5.757 u[IU]/mL — ABNORMAL HIGH (ref 0.350–4.500)

## 2022-08-22 LAB — MAGNESIUM: Magnesium: 2.1 mg/dL (ref 1.7–2.4)

## 2022-08-22 LAB — SARS CORONAVIRUS 2 BY RT PCR: SARS Coronavirus 2 by RT PCR: NEGATIVE

## 2022-08-22 MED ORDER — GUAIFENESIN-DM 100-10 MG/5ML PO SYRP
5.0000 mL | ORAL_SOLUTION | ORAL | Status: DC | PRN
  Administered 2022-08-22: 5 mL via ORAL
  Filled 2022-08-22: qty 5

## 2022-08-22 MED ORDER — TRAZODONE HCL 100 MG PO TABS: 100.0000 mg | ORAL_TABLET | Freq: Every day | ORAL | Status: DC

## 2022-08-22 MED ORDER — GUAIFENESIN ER 600 MG PO TB12
1200.0000 mg | ORAL_TABLET | Freq: Two times a day (BID) | ORAL | Status: DC
  Administered 2022-08-22: 1200 mg via ORAL
  Filled 2022-08-22 (×2): qty 2

## 2022-08-22 MED ORDER — LEVOTHYROXINE SODIUM 100 MCG PO TABS
100.0000 ug | ORAL_TABLET | Freq: Every day | ORAL | Status: DC
  Administered 2022-08-22: 100 ug via ORAL
  Filled 2022-08-22: qty 1

## 2022-08-22 MED ORDER — PERFLUTREN LIPID MICROSPHERE
1.0000 mL | INTRAVENOUS | Status: AC | PRN
  Administered 2022-08-22: 2 mL via INTRAVENOUS

## 2022-08-22 MED ORDER — ALBUTEROL SULFATE HFA 108 (90 BASE) MCG/ACT IN AERS: 2.0000 | INHALATION_SPRAY | RESPIRATORY_TRACT | 2 refills | Status: AC | PRN

## 2022-08-22 MED ORDER — ATORVASTATIN CALCIUM 40 MG PO TABS
40.0000 mg | ORAL_TABLET | Freq: Every day | ORAL | Status: DC
  Administered 2022-08-22 (×2): 40 mg via ORAL
  Filled 2022-08-22 (×2): qty 1

## 2022-08-22 MED ORDER — ONDANSETRON HCL 4 MG/2ML IJ SOLN: 4.0000 mg | Freq: Four times a day (QID) | INTRAMUSCULAR | Status: DC | PRN

## 2022-08-22 MED ORDER — PREDNISONE 20 MG PO TABS: 40.0000 mg | ORAL_TABLET | Freq: Every day | ORAL | 0 refills | Status: DC

## 2022-08-22 MED ORDER — PANTOPRAZOLE SODIUM 40 MG PO TBEC
40.0000 mg | DELAYED_RELEASE_TABLET | Freq: Every day | ORAL | Status: DC
  Administered 2022-08-22: 40 mg via ORAL
  Filled 2022-08-22: qty 1

## 2022-08-22 MED ORDER — ASPIRIN 81 MG PO TBEC
81.0000 mg | DELAYED_RELEASE_TABLET | Freq: Every day | ORAL | Status: DC
  Administered 2022-08-22: 81 mg via ORAL
  Filled 2022-08-22: qty 1

## 2022-08-22 MED ORDER — ACETAMINOPHEN 325 MG PO TABS
650.0000 mg | ORAL_TABLET | ORAL | Status: DC | PRN
  Administered 2022-08-22 (×2): 650 mg via ORAL
  Filled 2022-08-22 (×2): qty 2

## 2022-08-22 MED ORDER — PREDNISONE 20 MG PO TABS: 40.0000 mg | ORAL_TABLET | Freq: Every day | ORAL | 0 refills | Status: AC

## 2022-08-22 MED ORDER — METOPROLOL TARTRATE 50 MG PO TABS
50.0000 mg | ORAL_TABLET | Freq: Two times a day (BID) | ORAL | Status: DC | PRN
  Administered 2022-08-22: 50 mg via ORAL
  Filled 2022-08-22: qty 1

## 2022-08-22 MED ORDER — HEPARIN SODIUM (PORCINE) 5000 UNIT/ML IJ SOLN
5000.0000 [IU] | Freq: Three times a day (TID) | INTRAMUSCULAR | Status: DC
  Administered 2022-08-22 (×2): 5000 [IU] via SUBCUTANEOUS
  Filled 2022-08-22 (×2): qty 1

## 2022-08-22 MED ORDER — SERTRALINE HCL 100 MG PO TABS
100.0000 mg | ORAL_TABLET | Freq: Every day | ORAL | Status: DC
  Administered 2022-08-22: 100 mg via ORAL
  Filled 2022-08-22: qty 1

## 2022-08-22 MED ORDER — ALPRAZOLAM 0.25 MG PO TABS
0.2500 mg | ORAL_TABLET | Freq: Every day | ORAL | Status: DC | PRN
  Administered 2022-08-22: 0.25 mg via ORAL
  Filled 2022-08-22: qty 1

## 2022-08-22 MED ORDER — METHYLPREDNISOLONE SODIUM SUCC 125 MG IJ SOLR
125.0000 mg | Freq: Once | INTRAMUSCULAR | Status: AC
  Administered 2022-08-22: 125 mg via INTRAVENOUS
  Filled 2022-08-22: qty 2

## 2022-08-22 NOTE — Assessment & Plan Note (Signed)
Continue Synthroid °

## 2022-08-22 NOTE — ED Notes (Signed)
ED TO INPATIENT HANDOFF REPORT  ED Nurse Name and Phone #: Leigh Aurora [phone]  S Name/Age/Gender Jessica [name] 67 y.o. female Room/Bed: APA18/APA18  Code Status   Code Status: Full Code  Home/SNF/Other Home Patient oriented to: self, place, time, and situation Is this baseline? Yes   Triage Complete: Triage complete  Chief Complaint Chest pain [R07.9]  Triage Note Pt via POV sent from [facility] due to abnormal EKG. She went for evaluation of central chest pain that started yesterday with associated SOB, nausea, dizziness, and some diarrhea. CP radiates to mid back. UC EKG shows ST deviation and moderate t-wave abnormality, possible anterolateral ischemia. CP "tightness" rated 5/10 and pt seems anxious. Hx palpitations and knee replacement surgery 2 months ago.    Allergies No Known Allergies  Level of Care/Admitting Diagnosis ED Disposition     ED Disposition  Admit   Condition  --   Comment  Hospital Area: [facility] [[id number]]  Level of Care: Telemetry Cardiac [103]  May place patient in observation at [facility] or [facility] if equivalent level of care is available:: No  Covid Evaluation: Asymptomatic - no recent exposure (last 10 days) testing not required  Diagnosis: Chest pain [454098]  Admitting Physician: Lilyan Gilford [[id number]]  Attending Physician: Lilyan Gilford [[id number]]          B Medical/Surgery History Past Medical History:  Diagnosis Date   Anxiety    Breast mass, right    Chronic insomnia 01/24/2020   Esophageal stricture    GERD (gastroesophageal reflux disease)    Hiatal hernia    Hyperlipidemia    Hypertrophy of breast 01/2014   Hypothyroidism    TMJ syndrome    Vertigo    Past Surgical History:  Procedure Laterality Date   Quintella Reichert OSTEOTOMY Right 04/11/2014   Procedure: Quintella Reichert OSTEOTOMY;  Surgeon: Dallas Schimke, DPM;  Location: [facility];  Service: Podiatry;  Laterality: Right;    BREAST BIOPSY     BREAST LUMPECTOMY Right 07/29/2017   Procedure: RIGHT BREAST LUMPECTOMY;  Surgeon: Abigail Miyamoto, MD;  Location: [facility];  Service: General;  Laterality: Right;   BREAST REDUCTION SURGERY Bilateral 01/16/2014   Procedure: BILATERAL MAMMARY REDUCTION  (BREAST);  Surgeon: Karie Fetch, MD;  Location: [facility];  Service: Plastics;  Laterality: Bilateral;   BUNIONECTOMY Right 04/11/2014   Procedure: Serafina Royals;  Surgeon: Dallas Schimke, DPM;  Location: [facility];  Service: Podiatry;  Laterality: Right;   BUNIONECTOMY Right 12/25/2015   Procedure: REVISION Serafina Royals;  Surgeon: Ferman Hamming, DPM;  Location: [facility];  Service: Podiatry;  Laterality: Right;   CATARACT EXTRACTION, BILATERAL     COLONOSCOPY     FLEXOR TENOTOMY  Right 04/11/2014   Procedure: PERCUTANEOUS FLEXOR TENOTOMY 3RD DIGIT;  Surgeon: Dallas Schimke, DPM;  Location: [facility];  Service: Podiatry;  Laterality: Right;   HALLUX VALGUS BASE WEDGE Right 12/25/2015   Procedure: CLOSING BASE WEDGE OSTEOTOMY;  Surgeon: Ferman Hamming, DPM;  Location: [facility];  Service: Podiatry;  Laterality: Right;   mohs procedure Left 12/2016   2 areas of skin cancer on left leg    WEIL OSTEOTOMY Right 04/11/2014   Procedure: WEIL OSTEOTOMY 2ND METATARSAL RIGHT FOOT;  Surgeon: Dallas Schimke, DPM;  Location: [facility];  Service: Podiatry;  Laterality: Right;     A IV Location/Drains/Wounds Patient Lines/Drains/Airways Status     Active Line/Drains/Airways     Name Placement  date Placement time Site Days   Peripheral IV 08/21/22 18 G 1" Right Antecubital 08/21/22  2139  Antecubital  1   Incision (Closed) 01/16/14 Breast Right 01/16/14  0851  -- 3140   Incision (Closed) 01/16/14 Breast Left 01/16/14  0912  -- 3140   Incision (Closed) 04/11/14 Foot Right 04/11/14  0927  -- 3055   Incision (Closed) 12/25/15 Foot Right 12/25/15  1120  -- 2432    Incision (Closed) 07/29/17 Breast Right 07/29/17  0743  -- 1850            Intake/Output Last 24 hours No intake or output data in the 24 hours ending 08/22/22 0115  Labs/Imaging Results for orders placed or performed during the hospital encounter of 08/21/22 (from the past 48 hour(s))  Basic metabolic panel     Status: Abnormal   Collection Time: 08/21/22  7:38 PM  Result Value Ref Range   Sodium 137 135 - 145 mmol/L   Potassium 3.2 (L) 3.5 - 5.1 mmol/L   Chloride 104 98 - 111 mmol/L   CO2 24 22 - 32 mmol/L   Glucose, Bld 102 (H) 70 - 99 mg/dL    Comment: Glucose reference range applies only to samples taken after fasting for at least 8 hours.   BUN 16 8 - 23 mg/dL   Creatinine, Ser 1.61 0.44 - 1.00 mg/dL   Calcium 8.9 8.9 - 09.6 mg/dL   GFR, Estimated >04 >54 mL/min    Comment: (NOTE) Calculated using the CKD-EPI Creatinine Equation (2021)    Anion gap 9 5 - 15    Comment: Performed at [facility], [address], [place], [place] [address]  CBC     Status: None   Collection Time: 08/21/22  7:38 PM  Result Value Ref Range   WBC 7.0 4.0 - 10.5 K/uL   RBC 4.30 3.87 - 5.11 MIL/uL   Hemoglobin 12.6 12.0 - 15.0 g/dL   HCT 91.4 78.2 - 95.6 %   MCV 90.7 80.0 - 100.0 fL   MCH 29.3 26.0 - 34.0 pg   MCHC 32.3 30.0 - 36.0 g/dL   RDW 21.3 08.6 - 57.8 %   Platelets 150 150 - 400 K/uL   nRBC 0.0 0.0 - 0.2 %    Comment: Performed at [facility], [address] [place], [place] [address]  Troponin I (High Sensitivity)     Status: None   Collection Time: 08/21/22  7:38 PM  Result Value Ref Range   Troponin I (High Sensitivity) 3 <18 ng/L    Comment: (NOTE) Elevated high sensitivity troponin I (hsTnI) values and significant  changes across serial measurements may suggest ACS but many other  chronic and acute conditions are known to elevate hsTnI results.  Refer to the "Links" section for chest pain algorithms and additional  guidance. Performed at [facility], [address], [place], [place] [address]   Troponin I (High Sensitivity)     Status: None   Collection Time: 08/21/22  9:14 PM  Result Value Ref Range   Troponin I (High Sensitivity) 3 <18 ng/L    Comment: (NOTE) Elevated high sensitivity troponin I (hsTnI) values and significant  changes across serial measurements may suggest ACS but many other  chronic and acute conditions are known to elevate hsTnI results.  Refer to the "Links" section for chest pain algorithms and additional  guidance. Performed at [facility], [address], [place], [place] [address]    CT Angio Chest PE  W and/or Wo Contrast  Result Date: 08/21/2022 CLINICAL DATA:  Central chest pain. EXAM: CT ANGIOGRAPHY CHEST WITH CONTRAST TECHNIQUE: Multidetector CT imaging of the chest was performed using the standard protocol during bolus administration of intravenous contrast. Multiplanar CT image reconstructions and MIPs were obtained to evaluate the vascular anatomy. RADIATION DOSE REDUCTION: This exam was performed according to the departmental dose-optimization program which includes automated exposure control, adjustment of the mA and/or kV according to patient size and/or use of iterative reconstruction technique. CONTRAST:  75mL OMNIPAQUE IOHEXOL 350 MG/ML SOLN COMPARISON:  None Available. FINDINGS: Cardiovascular: The thoracic aorta is normal in appearance. Satisfactory opacification of the pulmonary arteries to the segmental level. No evidence of pulmonary embolism. Normal heart size. No pericardial effusion. Mediastinum/Nodes: No enlarged mediastinal, hilar, or axillary lymph nodes. Thyroid gland, trachea, and esophagus demonstrate no significant findings. Lungs/Pleura: Mild linear atelectasis is seen within the right lower lobe. Mild right lower lobe peribronchial thickening is also noted. There is no evidence of a pleural effusion or pneumothorax. Upper Abdomen: No acute abnormality. Musculoskeletal: No chest wall abnormality. No acute or  significant osseous findings. Review of the MIP images confirms the above findings. IMPRESSION: 1. No evidence of pulmonary embolism. 2. Mild right lower lobe atelectasis and peribronchial thickening which may represent mild bronchitis. Electronically Signed   By: Aram Candela M.D.   On: 08/21/2022 22:19   DG Chest 2 View  Result Date: 08/21/2022 CLINICAL DATA:  Chest pain EXAM: CHEST - 2 VIEW COMPARISON:  None Available. FINDINGS: No consolidation, pneumothorax or effusion. No edema. Normal cardiopericardial silhouette. Mild degenerative changes of the spine. Deformity of right-sided ribcage. Possibly related to remote trauma or other process IMPRESSION: No acute cardiopulmonary disease Electronically Signed   By: Karen Kays M.D.   On: 08/21/2022 19:14    Pending Labs [employer] (From admission, onward)     Start     Ordered   Signed and Held  HIV Antibody (routine testing w rflx)  (HIV Antibody (Routine testing w reflex) panel)  Once,   R        Signed and Held   Signed and Held  CBC  Tomorrow morning,   R        Signed and Held   Signed and Held  Comprehensive metabolic panel  Tomorrow morning,   R        Signed and Held   Signed and Held  Magnesium  Tomorrow morning,   R        Signed and Held   Signed and Held  TSH  Tomorrow morning,   R        Signed and Held            Vitals/Pain Today's Vitals   08/21/22 1809 08/21/22 1810 08/21/22 2100 08/21/22 2237  BP: (!) 124/91  (!) 108/48   Pulse: (!) 101  67 84  Resp: 18  15 16   Temp: 98.9 F (37.2 C)   98.5 F (36.9 C)  TempSrc: Oral   Oral  SpO2: 99%  92% 94%  Weight:  175 lb (79.4 kg)    Height:  5\' 5"  (1.651 m)    PainSc:  5       Isolation Precautions No active isolations  Medications Medications  atorvastatin (LIPITOR) tablet 40 mg (has no administration in time range)  metoprolol tartrate (LOPRESSOR) tablet 50 mg (has no administration in time range)  ALPRAZolam (XANAX) tablet 0.25 mg (has no  administration in time  range)  ondansetron (ZOFRAN-ODT) disintegrating tablet 4 mg (4 mg Oral Given 08/21/22 2022)  iohexol (OMNIPAQUE) 350 MG/ML injection 75 mL (75 mLs Intravenous Contrast Given 08/21/22 2151)  potassium chloride (KLOR-CON) packet 40 mEq (40 mEq Oral Given 08/21/22 2312)    Mobility walks     Focused Assessments    R Recommendations: See Admitting Provider Note  Report given to:   Additional Notes:

## 2022-08-22 NOTE — Assessment & Plan Note (Addendum)
-   Chest pain, fatigue, worse with exertion -Troponins normal at 3, 3 - EKG shows some T wave inversions - CTA shows no PE - Chest x-ray shows no acute cardiopulmonary disease - Cardiology was consulted and recommended admission to [facility] - Continue beta-blocker, statin, aspirin - Monitor on telemetry - Inpatient cardiology consult in the a.m.

## 2022-08-22 NOTE — Plan of Care (Signed)
Pt able to d/c home with medication. Pt A&OX4. No SOB BP (!) 99/52 (BP Location: Left Arm)   Pulse 63   Temp 98.5 F (36.9 C) (Oral)   Resp 18   Ht 5\' 5"  (1.651 m)   Wt 85.3 kg   SpO2 92%   BMI 31.28 kg/m  AVS printed and reviewed all questions answered IV removed pt belongings returned. Pt taken off floor by staff. Reva Bores 08/22/22 5:26 PM

## 2022-08-22 NOTE — Assessment & Plan Note (Signed)
-   Patient reports a history of palpitations and this is the reason she is on beta-blocker per her report - Continue beta-blocker

## 2022-08-22 NOTE — H&P (Signed)
History and Physical    Patient: Jessica [name] ZOX:[id number] DOB: 1956-01-09 DOA: 08/21/2022 DOS: the patient was seen and examined on 08/22/2022 PCP: Luella Cook, MD  Patient coming from: Home  Chief Complaint:  Chief Complaint  Patient presents with   Chest Pain   HPI: Jessica [name] is a 67 y.o. female with medical history significant of anxiety, GERD, hyperlipidemia, hypothyroidism, palpitations, and more presents the ED with a chief complaint of chest pain.  Patient reports that she has had a chest tightness and fatigue.  She been doing PT for her knee since she had a knee replacement and her tightness in fatigue has been worse with this exertion.  She reports associated dyspnea and lightheadedness.  She has not had any syncopal episodes.  She has palpitations at night.  This is the reason that she is on a beta-blocker.  Patient reports that the day prior to presentation she had a stomach bug.  She reports nausea but no vomiting.  She had diarrhea all through the night, and is not sure if there was blood in it or not.  She had crampy abdominal pain.  The diarrhea and abdominal pain are both resolved today.  She reports she has not had much to eat for fear of upsetting her stomach.  Patient denies any fever.  She denies dysuria..  Patient reports she was recently seen by ENT for bronchitis and prescribed an antibiotic.  She took 1 dose of it today.  Patient reports no CAD in immediate family.  She does report arrhythmias in mom and dad.  Dad had a pacemaker.  Patient does not smoke.  She drinks 2-3 times per week but has never had withdrawals.  She does not use illicit drugs.  Patient is vaccinated for COVID.  Patient is full code. Review of Systems: As mentioned in the history of present illness. All other systems reviewed and are negative. Past Medical History:  Diagnosis Date   Anxiety    Breast mass, right    Chronic insomnia 01/24/2020   Esophageal stricture    GERD  (gastroesophageal reflux disease)    Hiatal hernia    Hyperlipidemia    Hypertrophy of breast 01/2014   Hypothyroidism    TMJ syndrome    Vertigo    Past Surgical History:  Procedure Laterality Date   Quintella Reichert OSTEOTOMY Right 04/11/2014   Procedure: Quintella Reichert OSTEOTOMY;  Surgeon: Dallas Schimke, DPM;  Location: [facility];  Service: Podiatry;  Laterality: Right;   BREAST BIOPSY     BREAST LUMPECTOMY Right 07/29/2017   Procedure: RIGHT BREAST LUMPECTOMY;  Surgeon: Abigail Miyamoto, MD;  Location: [facility];  Service: General;  Laterality: Right;   BREAST REDUCTION SURGERY Bilateral 01/16/2014   Procedure: BILATERAL MAMMARY REDUCTION  (BREAST);  Surgeon: Karie Fetch, MD;  Location: [facility];  Service: Plastics;  Laterality: Bilateral;   BUNIONECTOMY Right 04/11/2014   Procedure: Serafina Royals;  Surgeon: Dallas Schimke, DPM;  Location: [facility];  Service: Podiatry;  Laterality: Right;   BUNIONECTOMY Right 12/25/2015   Procedure: REVISION Serafina Royals;  Surgeon: Ferman Hamming, DPM;  Location: [facility];  Service: Podiatry;  Laterality: Right;   CATARACT EXTRACTION, BILATERAL     COLONOSCOPY     FLEXOR TENOTOMY  Right 04/11/2014   Procedure: PERCUTANEOUS FLEXOR TENOTOMY 3RD DIGIT;  Surgeon: Dallas Schimke, DPM;  Location: [facility];  Service: Podiatry;  Laterality: Right;   HALLUX VALGUS BASE WEDGE Right 12/25/2015  Procedure: CLOSING BASE WEDGE OSTEOTOMY;  Surgeon: Ferman Hamming, DPM;  Location: [facility];  Service: Podiatry;  Laterality: Right;   mohs procedure Left 12/2016   2 areas of skin cancer on left leg    WEIL OSTEOTOMY Right 04/11/2014   Procedure: WEIL OSTEOTOMY 2ND METATARSAL RIGHT FOOT;  Surgeon: Dallas Schimke, DPM;  Location: [facility];  Service: Podiatry;  Laterality: Right;   Social History:  reports that she has never smoked. She has never used smokeless tobacco. She reports current alcohol use. She  reports that she does not use drugs.  No Known Allergies  Family History  Problem Relation Age of Onset   Stroke Mother    Heart disease Father    Other Son 84       suicide   Colon cancer Maternal Grandfather    Throat cancer Neg Hx    Stomach cancer Neg Hx    Rectal cancer Neg Hx    Esophageal cancer Neg Hx     Prior to Admission medications   Medication Sig Start Date End Date Taking? Authorizing Provider  ALPRAZolam (XANAX) 0.25 MG tablet Take 0.25 mg by mouth daily as needed. 10/08/21   [provider]  aspirin EC 81 MG tablet Take 81 mg by mouth daily.    [provider]  atorvastatin (LIPITOR) 40 MG tablet Take 40 mg by mouth daily.    [provider]  celecoxib (CELEBREX) 200 MG capsule Take 1 capsule by mouth 2 (two) times daily. 09/10/21   [provider]  cetirizine (ZYRTEC) 10 MG tablet Take 10 mg by mouth daily as needed.     [provider]  co-enzyme Q-10 30 MG capsule Take 200 mg by mouth daily.    [provider]  doxycycline (VIBRAMYCIN) 50 MG capsule Take 50 mg by mouth daily.    [provider]  esomeprazole (NEXIUM) 40 MG capsule Take 1 capsule (40 mg total) by mouth 2 (two) times daily before a meal. Pt takes BRAND  Take before breakfast and dinner. 09/30/18   Esterwood, Amy S, PA-C  levothyroxine (SYNTHROID) 100 MCG tablet Take 100 mcg by mouth daily before breakfast. Pt takes BRAND    [provider]  loratadine-pseudoephedrine (CLARITIN-D 24-HOUR) 10-240 MG 24 hr tablet Take 1 tablet by mouth daily.    [provider]  metoprolol tartrate (LOPRESSOR) 50 MG tablet Take 50 mg by mouth 2 (two) times daily as needed (Takes in the morning, 2nd dose is PRN).    [provider]  Multiple Vitamin (MULTIVITAMIN) tablet Take 1 tablet by mouth daily.    [provider]  Niacinamide-Zn-Cu-Methfo-Se-Cr (NICOTINAMIDE PO) Take 500 mg by mouth daily.    [provider]   SERTRALINE HCL PO Take 100 mg by mouth daily.    [provider]  traZODone (DESYREL) 50 MG tablet Take 2 tablets (100 mg total) by mouth at bedtime. 01/24/20   York Spaniel, MD  Turmeric (QC TUMERIC COMPLEX PO) Take 900 mg by mouth daily.    [provider]    Physical Exam: Vitals:   08/21/22 1810 08/21/22 2100 08/21/22 2237 08/22/22 0128  BP:  (!) 108/48  (!) 115/49  Pulse:  67 84 76  Resp:  15 16   Temp:   98.5 F (36.9 C)   TempSrc:   Oral   SpO2:  92% 94%   Weight: 79.4 kg     Height: 5\' 5"  (1.651 m)  1.  General: Patient lying supine in bed,  no acute distress   2. Psychiatric: Alert and oriented x 3, mood and behavior normal for situation, pleasant and cooperative with exam   3. Neurologic: Speech and language are normal, face is symmetric, moves all 4 extremities voluntarily, at baseline without acute deficits on limited exam   4. HEENMT:  Head is atraumatic, normocephalic, pupils reactive to light, neck is supple, trachea is midline, mucous membranes are moist   5. Respiratory : Lungs are clear to auscultation bilaterally without wheezing, rhonchi, rales, no cyanosis, no increase in work of breathing or accessory muscle use   6. Cardiovascular : Heart rate normal, rhythm is regular, no murmurs, rubs or gallops, trace peripheral edema, peripheral pulses palpated   7. Gastrointestinal:  Abdomen is soft, nondistended, nontender to palpation bowel sounds active, no masses or organomegaly palpated   8. Skin:  Skin is warm, dry and intact without rashes, acute lesions, or ulcers on limited exam   9.Musculoskeletal:  No acute deformities or trauma, no asymmetry in tone, trace peripheral edema, peripheral pulses palpated, no tenderness to palpation in the extremities  Data Reviewed: Temp 98.5, heart rate 67-101, respiratory rate 15-18, blood pressure 180/48-124/91, satting 92-99% No leukocytosis with white blood cell count of 7.0,  hemoglobin 12.6 Chemistry reveals a hypokalemia at 3.2 CTA shows no PE but does show mild bronchitis Chest x-ray shows no acute cardiopulmonary disease EKG shows a heart rate of 87, sinus rhythm, QTc 389 with T wave inversions in V3, V4, V5, V6 Troponin 3, 3 Zofran 4 mg given Cardiology consulted by ED recommended admission to [facility] for inpatient cardiology evaluation   Assessment and Plan: * Chest pain - Chest pain, fatigue, worse with exertion -Troponins normal at 3, 3 - EKG shows some T wave inversions - CTA shows no PE - Chest x-ray shows no acute cardiopulmonary disease - Cardiology was consulted and recommended admission to [facility] - Continue beta-blocker, statin, aspirin - Monitor on telemetry - Inpatient cardiology consult in the a.m.   Palpitations - Patient reports a history of palpitations and this is the reason she is on beta-blocker per her report - Continue beta-blocker  GERD (gastroesophageal reflux disease) - Continue PPI  Hypothyroidism - Continue Synthroid  Anxiety - Patient reports she does not take trazodone anymore secondary to dry mouth - Continue her Xanax and her Zoloft - Continue to monitor  Hypokalemia - Potassium 3.2 - 40 mEq of potassium replaced - Recheck in the a.m.      Advance Care Planning:   Code Status: Full Code  Consults: Cardiology  Family Communication: No family at bedside  Severity of Illness: The appropriate patient status for this patient is OBSERVATION. Observation status is judged to be reasonable and necessary in order to provide the required intensity of service to ensure the patient's safety. The patient's presenting symptoms, physical exam findings, and initial radiographic and laboratory data in the context of their medical condition is felt to place them at decreased risk for further clinical deterioration. Furthermore, it is anticipated that the patient will be medically stable for discharge from the  hospital within 2 midnights of admission.   Author: Lilyan Gilford, DO 08/22/2022 2:02 AM  For on call review [email or web address].

## 2022-08-22 NOTE — Assessment & Plan Note (Signed)
Continue PPI ?

## 2022-08-22 NOTE — Assessment & Plan Note (Addendum)
-   Patient reports she does not take trazodone anymore secondary to dry mouth - Continue her Xanax and her Zoloft - Continue to monitor

## 2022-08-22 NOTE — Assessment & Plan Note (Signed)
-   Potassium 3.2 - 40 mEq of potassium replaced - Recheck in the a.m.

## 2022-08-22 NOTE — Consult Note (Signed)
Cardiology Consultation   Patient ID: [name] MRN: [id number]; DOB: 08-13-1955  Admit date: 08/21/2022 Date of Consult: 08/22/2022  PCP:  Luella Cook, MD   [facility] Providers Cardiologist:  None        Patient Profile:   Jessica [name] is a 67 y.o. female with a hx of anxiety, GERD, hyperlipidemia, hypothyroidism, palpitations, who is being seen 08/22/2022 for the evaluation of chest tightness at the request of Dr Frederick Peers.  History of Present Illness:   Jessica [name] is a 67 y.o. female with a hx of anxiety, GERD, hyperlipidemia, hypothyroidism, palpitations, who is being seen 08/22/2022 for the evaluation of chest tightness   Patient reports 1 week history of cough, got diagnosed with bronchitis and her PCP gave antibiotics on Wednesday, however, she had worsening stomach upset, diarrhea on Thursday, also notes chest tightness with coughing and on exertion thus went to urgent care and was sent to the ER for eval.  She had knee surgery 2 months ago. ER work up- CT PE negative EKG TWI in anterior leads and non specific st-t wave changes Tropx 2 negative Repeat EKG this am non specific st-twave changes.  Patient does not smoke. She drinks 2-3 times per week but has never had withdrawals. She does not use illicit drugs. Patient is vaccinated for COVID. Patient is full code.  No family h/o premature CAD   Past Medical History:  Diagnosis Date   Anxiety    Breast mass, right    Chronic insomnia 01/24/2020   Esophageal stricture    GERD (gastroesophageal reflux disease)    Hiatal hernia    Hyperlipidemia    Hypertrophy of breast 01/2014   Hypothyroidism    TMJ syndrome    Vertigo     Past Surgical History:  Procedure Laterality Date   Quintella Reichert OSTEOTOMY Right 04/11/2014   Procedure: Quintella Reichert OSTEOTOMY;  Surgeon: Dallas Schimke, DPM;  Location: [facility];  Service: Podiatry;  Laterality: Right;   BREAST BIOPSY     BREAST LUMPECTOMY  Right 07/29/2017   Procedure: RIGHT BREAST LUMPECTOMY;  Surgeon: Abigail Miyamoto, MD;  Location: [facility];  Service: General;  Laterality: Right;   BREAST REDUCTION SURGERY Bilateral 01/16/2014   Procedure: BILATERAL MAMMARY REDUCTION  (BREAST);  Surgeon: Karie Fetch, MD;  Location: [facility];  Service: Plastics;  Laterality: Bilateral;   BUNIONECTOMY Right 04/11/2014   Procedure: Serafina Royals;  Surgeon: Dallas Schimke, DPM;  Location: [facility];  Service: Podiatry;  Laterality: Right;   BUNIONECTOMY Right 12/25/2015   Procedure: REVISION Serafina Royals;  Surgeon: Ferman Hamming, DPM;  Location: [facility];  Service: Podiatry;  Laterality: Right;   CATARACT EXTRACTION, BILATERAL     COLONOSCOPY     FLEXOR TENOTOMY  Right 04/11/2014   Procedure: PERCUTANEOUS FLEXOR TENOTOMY 3RD DIGIT;  Surgeon: Dallas Schimke, DPM;  Location: [facility];  Service: Podiatry;  Laterality: Right;   HALLUX VALGUS BASE WEDGE Right 12/25/2015   Procedure: CLOSING BASE WEDGE OSTEOTOMY;  Surgeon: Ferman Hamming, DPM;  Location: [facility];  Service: Podiatry;  Laterality: Right;   mohs procedure Left 12/2016   2 areas of skin cancer on left leg    WEIL OSTEOTOMY Right 04/11/2014   Procedure: WEIL OSTEOTOMY 2ND METATARSAL RIGHT FOOT;  Surgeon: Dallas Schimke, DPM;  Location: [facility];  Service: Podiatry;  Laterality: Right;     Home Medications:  Prior to Admission medications   Medication Sig [name] Date  End Date Taking? Authorizing Provider  ALPRAZolam (XANAX) 0.25 MG tablet Take 0.25 mg by mouth daily as needed. 10/08/21   [provider]  aspirin EC 81 MG tablet Take 81 mg by mouth daily.    [provider]  atorvastatin (LIPITOR) 40 MG tablet Take 40 mg by mouth daily.    [provider]  celecoxib (CELEBREX) 200 MG capsule Take 1 capsule by mouth 2 (two) times daily. 09/10/21   [provider]  cetirizine (ZYRTEC)  10 MG tablet Take 10 mg by mouth daily as needed.     [provider]  co-enzyme Q-10 30 MG capsule Take 200 mg by mouth daily.    [provider]  doxycycline (VIBRAMYCIN) 50 MG capsule Take 50 mg by mouth daily.    [provider]  esomeprazole (NEXIUM) 40 MG capsule Take 1 capsule (40 mg total) by mouth 2 (two) times daily before a meal. Pt takes BRAND  Take before breakfast and dinner. 09/30/18   Esterwood, Amy S, PA-C  levothyroxine (SYNTHROID) 100 MCG tablet Take 100 mcg by mouth daily before breakfast. Pt takes BRAND    [provider]  loratadine-pseudoephedrine (CLARITIN-D 24-HOUR) 10-240 MG 24 hr tablet Take 1 tablet by mouth daily.    [provider]  metoprolol tartrate (LOPRESSOR) 50 MG tablet Take 50 mg by mouth 2 (two) times daily as needed (Takes in the morning, 2nd dose is PRN).    [provider]  Multiple Vitamin (MULTIVITAMIN) tablet Take 1 tablet by mouth daily.    [provider]  Niacinamide-Zn-Cu-Methfo-Se-Cr (NICOTINAMIDE PO) Take 500 mg by mouth daily.    [provider]  SERTRALINE HCL PO Take 100 mg by mouth daily.    [provider]  traZODone (DESYREL) 50 MG tablet Take 2 tablets (100 mg total) by mouth at bedtime. 01/24/20   York Spaniel, MD  Turmeric (QC TUMERIC COMPLEX PO) Take 900 mg by mouth daily.    [provider]    Inpatient Medications: Scheduled Meds:  aspirin EC  81 mg Oral Daily   atorvastatin  40 mg Oral Daily   heparin  5,000 Units Subcutaneous Q8H   levothyroxine  100 mcg Oral QAC breakfast   pantoprazole  40 mg Oral Daily   sertraline  100 mg Oral Daily   traZODone  100 mg Oral QHS   Continuous Infusions:  PRN Meds: acetaminophen, ALPRAZolam, metoprolol tartrate, ondansetron (ZOFRAN) IV  Allergies:   No Known Allergies  Social History:   Social History   Socioeconomic History   Marital status: Married    Spouse name: Brett Canales   Number of  children: 3   Years of education: Not on file   Highest education level: Not on file  Occupational History   Occupation: homemaker  Tobacco Use   Smoking status: Never   Smokeless tobacco: Never  Vaping Use   Vaping Use: Never used  Substance and Sexual Activity   Alcohol use: Yes    Comment: occasionally   Drug use: No   Sexual activity: Yes    Birth control/protection: Post-menopausal  Other Topics Concern   Not on file  Social History Narrative   Lives with spouse only   Drinks 1-2 cups of caffeine   Right Handed   Social Determinants of Health   Financial Resource Strain: Not on file  Food Insecurity: No Food Insecurity (08/22/2022)   Hunger Vital Sign    Worried About Running Out of Food in the  Last Year: Never true    Ran Out of Food in the Last Year: Never true  Transportation Needs: Not on file  Physical Activity: Not on file  Stress: Not on file  Social Connections: Not on file  Intimate Partner Violence: Not on file    Family History:    Family History  Problem Relation Age of Onset   Stroke Mother    Heart disease Father    Other Son 76       suicide   Colon cancer Maternal Grandfather    Throat cancer Neg Hx    Stomach cancer Neg Hx    Rectal cancer Neg Hx    Esophageal cancer Neg Hx      ROS:  Please see the history of present illness.   All other ROS reviewed and negative.     Physical Exam/Data:   Vitals:   08/21/22 2237 08/22/22 0128 08/22/22 0319 08/22/22 0500  BP:  (!) 115/49  (!) 97/49  Pulse: 84 76  60  Resp: 16   17  Temp: 98.5 F (36.9 C)   99.4 F (37.4 C)  TempSrc: Oral   Oral  SpO2: 94%   93%  Weight:   85.3 kg   Height:   5\' 5"  (1.651 m)    No intake or output data in the 24 hours ending 08/22/22 0716    08/22/2022    3:19 AM 08/21/2022    6:10 PM 11/05/2021    1:18 PM  Last 3 Weights  Weight (lbs) 188 lb 175 lb 192 lb  Weight (kg) 85.276 kg 79.379 kg 87.091 kg     Body mass index is 31.28 kg/m.  General:  Well  nourished, well developed, in no acute distress HEENT: normal Neck: no JVD Vascular: No carotid bruits; Distal pulses 2+ bilaterally Cardiac:  normal S1, S2; RRR; no murmur  Lungs:  clear to auscultation bilaterally, no wheezing, rhonchi or rales  Abd: soft, nontender, no hepatomegaly  Ext: no edema Musculoskeletal:  No deformities, BUE and BLE strength normal and equal Skin: warm and dry  Neuro:  CNs 2-12 intact, no focal abnormalities noted Psych:  Normal affect   EKG:  The EKG was personally reviewed and demonstrates:  TWI in anterior leads, non specific st-t wave changes   Laboratory Data:  High Sensitivity Troponin:   Recent Labs  Lab 08/21/22 1938 08/21/22 2114  TROPONINIHS 3 3     Chemistry Recent Labs  Lab 08/21/22 1938  NA 137  K 3.2*  CL 104  CO2 24  GLUCOSE 102*  BUN 16  CREATININE 0.76  CALCIUM 8.9  GFRNONAA >60  ANIONGAP 9    No results for input(s): "PROT", "ALBUMIN", "AST", "ALT", "ALKPHOS", "BILITOT" in the last 168 hours. Lipids No results for input(s): "CHOL", "TRIG", "HDL", "LABVLDL", "LDLCALC", "CHOLHDL" in the last 168 hours.  Hematology Recent Labs  Lab 08/21/22 1938 08/22/22 0636  WBC 7.0 7.1  RBC 4.30 4.08  HGB 12.6 11.7*  HCT 39.0 36.4  MCV 90.7 89.2  MCH 29.3 28.7  MCHC 32.3 32.1  RDW 13.2 13.2  PLT 150 147*   Thyroid No results for input(s): "TSH", "FREET4" in the last 168 hours.  BNPNo results for input(s): "BNP", "PROBNP" in the last 168 hours.  DDimer No results for input(s): "DDIMER" in the last 168 hours.   Radiology/Studies:  CT Angio Chest PE W and/or Wo Contrast  Result Date: 08/21/2022 CLINICAL DATA:  Central chest pain. EXAM: CT ANGIOGRAPHY CHEST  WITH CONTRAST TECHNIQUE: Multidetector CT imaging of the chest was performed using the standard protocol during bolus administration of intravenous contrast. Multiplanar CT image reconstructions and MIPs were obtained to evaluate the vascular anatomy. RADIATION DOSE  REDUCTION: This exam was performed according to the departmental dose-optimization program which includes automated exposure control, adjustment of the mA and/or kV according to patient size and/or use of iterative reconstruction technique. CONTRAST:  75mL OMNIPAQUE IOHEXOL 350 MG/ML SOLN COMPARISON:  None Available. FINDINGS: Cardiovascular: The thoracic aorta is normal in appearance. Satisfactory opacification of the pulmonary arteries to the segmental level. No evidence of pulmonary embolism. Normal heart size. No pericardial effusion. Mediastinum/Nodes: No enlarged mediastinal, hilar, or axillary lymph nodes. Thyroid gland, trachea, and esophagus demonstrate no significant findings. Lungs/Pleura: Mild linear atelectasis is seen within the right lower lobe. Mild right lower lobe peribronchial thickening is also noted. There is no evidence of a pleural effusion or pneumothorax. Upper Abdomen: No acute abnormality. Musculoskeletal: No chest wall abnormality. No acute or significant osseous findings. Review of the MIP images confirms the above findings. IMPRESSION: 1. No evidence of pulmonary embolism. 2. Mild right lower lobe atelectasis and peribronchial thickening which may represent mild bronchitis. Electronically Signed   By: Aram Candela M.D.   On: 08/21/2022 22:19   DG Chest 2 View  Result Date: 08/21/2022 CLINICAL DATA:  Chest pain EXAM: CHEST - 2 VIEW COMPARISON:  None Available. FINDINGS: No consolidation, pneumothorax or effusion. No edema. Normal cardiopericardial silhouette. Mild degenerative changes of the spine. Deformity of right-sided ribcage. Possibly related to remote trauma or other process IMPRESSION: No acute cardiopulmonary disease Electronically Signed   By: Karen Kays M.D.   On: 08/21/2022 19:14     Assessment and Plan:   Cough, SOB,- ac bronchitis Stomach upset/diarrhea- ?viral symptoms Chest tightness/ while coughing and exertion TWI in anterior  leads HLD Hypothyroidism   Plan: - pt admitted to hospitalist service. Initial EKG- TWI in anterior leads, slight in inferior leads. Repeat EKG this am. She has no chest pain currently. She only had chest tightness with exertion. She had all these viral prodromal symptoms and still ongoing cough/bronchitis. Not much risk factors.  - check ECHO given viral symptoms. Trops negative, not behaving like myocarditis. Nothing on exam or EKG to suggest pericarditis either. - bronchitis mx per hospitalist team - repeat EKG this am shows non specific st-t wave changes - if echo looks normal then likely could discharged and get outpatient ishcemic work up.  - continue aspirin, statin and home dose of metoprolol.  Cardiology team to see her this am    Risk Assessment/Risk Scores:                For questions or updates, please contact [facility] Please consult [email or web address] for contact info under    Signed, Elmon Kirschner, MD  08/22/2022 7:16 AM

## 2022-08-22 NOTE — Progress Notes (Signed)
Cardiology Progress Note  Patient Name: Jessica [name] Date of Encounter: 08/22/2022    Subjective   Doing well in bed, supine. No chest discomfort currently. She has had chest tightness with exertion.  Inpatient Medications    Scheduled Meds:  aspirin EC  81 mg Oral Daily   atorvastatin  40 mg Oral Daily   guaiFENesin  1,200 mg Oral BID   heparin  5,000 Units Subcutaneous Q8H   levothyroxine  100 mcg Oral QAC breakfast   pantoprazole  40 mg Oral Daily   sertraline  100 mg Oral Daily   traZODone  100 mg Oral QHS   Continuous Infusions:  PRN Meds: acetaminophen, ALPRAZolam, guaiFENesin-dextromethorphan, metoprolol tartrate, ondansetron (ZOFRAN) IV, perflutren lipid microspheres (DEFINITY) IV suspension   Vital Signs    Vitals:   08/22/22 0128 08/22/22 0319 08/22/22 0500 08/22/22 1325  BP: (!) 115/49  (!) 97/49 (!) 88/52  Pulse: 76  60 (!) 58  Resp:   17 18  Temp:   99.4 F (37.4 C) 99.2 F (37.3 C)  TempSrc:   Oral Oral  SpO2:   93% 93%  Weight:  85.3 kg    Height:  5\' 5"  (1.651 m)     No intake or output data in the 24 hours ending 08/22/22 1427 Filed Weights   08/21/22 1810 08/22/22 0319  Weight: 79.4 kg 85.3 kg    Physical Exam    GEN- The patient is well appearing, alert and oriented x 3 today.   Lungs- Clear to ausculation bilaterally, normal work of breathing Heart- Regular rate and rhythm, no murmurs, rubs or gallops Extremities- no clubbing, cyanosis, or edema Neuro- strength and sensation are intact  Labs      Telemetry    Sinus rhythm (personally reviewed)  Radiology    CT Angio Chest PE W and/or Wo Contrast  Result Date: 08/21/2022 CLINICAL DATA:  Central chest pain. EXAM: CT ANGIOGRAPHY CHEST WITH CONTRAST TECHNIQUE: Multidetector CT imaging of the chest was performed using the standard protocol during bolus administration of intravenous contrast. Multiplanar CT image reconstructions and MIPs were obtained to evaluate the  vascular anatomy. RADIATION DOSE REDUCTION: This exam was performed according to the departmental dose-optimization program which includes automated exposure control, adjustment of the mA and/or kV according to patient size and/or use of iterative reconstruction technique. CONTRAST:  75mL OMNIPAQUE IOHEXOL 350 MG/ML SOLN COMPARISON:  None Available. FINDINGS: Cardiovascular: The thoracic aorta is normal in appearance. Satisfactory opacification of the pulmonary arteries to the segmental level. No evidence of pulmonary embolism. Normal heart size. No pericardial effusion. Mediastinum/Nodes: No enlarged mediastinal, hilar, or axillary lymph nodes. Thyroid gland, trachea, and esophagus demonstrate no significant findings. Lungs/Pleura: Mild linear atelectasis is seen within the right lower lobe. Mild right lower lobe peribronchial thickening is also noted. There is no evidence of a pleural effusion or pneumothorax. Upper Abdomen: No acute abnormality. Musculoskeletal: No chest wall abnormality. No acute or significant osseous findings. Review of the MIP images confirms the above findings. IMPRESSION: 1. No evidence of pulmonary embolism. 2. Mild right lower lobe atelectasis and peribronchial thickening which may represent mild bronchitis. Electronically Signed   By: Aram Candela M.D.   On: 08/21/2022 22:19   DG Chest 2 View  Result Date: 08/21/2022 CLINICAL DATA:  Chest pain EXAM: CHEST - 2 VIEW COMPARISON:  None Available. FINDINGS: No consolidation, pneumothorax or effusion. No edema. Normal cardiopericardial silhouette. Mild degenerative changes of the spine. Deformity of right-sided ribcage. Possibly related to  remote trauma or other process IMPRESSION: No acute cardiopulmonary disease Electronically Signed   By: Karen Kays M.D.   On: 08/21/2022 19:14     Patient Profile     Jessica [name] is a 67 y.o. female with a past medical history significant for anxiety, GERD, hyperlipidemia,  hypothyroidism, palpitations .  she was admitted for chest tightness with exertion and ST wave changes.   Assessment & Plan     Chest pain Troponin negative, CTA no PE Nonspecific T wave changes today TTE pending Suspect this is of pulmonary origin with respiratory infection  History of palpitations Continue metoprolol XL No arrhythmia noted on monitor  Thyroid disease TSH 5.75  Disposition: If tte is normal, patient may discharge with outpatient follow-up. She can continue her home medications including aspirin 81, atorva 40, and metoprolol 25   York Pellant MD 08/22/2022 2:27 PM    For questions or updates, please contact [facility] Please consult [email or web address] for contact info under Cardiology/STEMI.  Signed, Maurice Small, MD  08/22/2022, 2:27 PM

## 2022-08-23 NOTE — Discharge Summary (Signed)
Physician Discharge Summary   Jessica [name] ZOX:[id number] DOB: 28-Mar-1956 DOA: 08/21/2022  PCP: Luella Cook, MD  Admit date: 08/21/2022 Discharge date: 08/22/2022   Admitted From: Home Disposition:  Home Discharging physician: Lewie Chamber, MD Barriers to discharge: none  Recommendations at discharge: Continue chronic medical management   Discharge Condition: stable CODE STATUS: Full Diet recommendation:  Diet Orders (From admission, onward)     Start     Ordered   08/22/22 0000  Diet general        08/22/22 1651            Hospital Course: Ms. Ignatius is a 67 year old female with PMH anxiety, GERD, HLD, hypothyroidism who presented with chest pain and cough. She had been evaluated outpatient and found to have an abnormal EKG and was referred to the hospital for further workup. She also had recently been diagnosed with bronchitis and started on outpatient antibiotics. EKG was concerning for T wave inversions in anterior and nonspecific ST wave changes.  Troponins were negative.  Cardiology was also consulted on admission. She underwent further infectious workup and was found to be positive for rhinovirus.  Her symptoms were considered viral in nature causing her underlying noncardiac chest pain.  She was continued on prednisone course at discharge.   The patient's chronic medical conditions were treated accordingly per the patient's home medication regimen except as noted.  On day of discharge, patient was felt deemed stable for discharge. Patient/family member advised to call PCP or come back to ER if needed.   Principal Diagnosis: Chest pain  Discharge Diagnoses: Active Hospital Problems   Diagnosis Date Noted   Acute bronchitis due to Rhinovirus 08/22/2022    Priority: 1.   Hypokalemia 08/22/2022   Anxiety 08/22/2022   Hypothyroidism 08/22/2022   GERD (gastroesophageal reflux disease) 08/22/2022   Palpitations 08/22/2022    Resolved Hospital Problems    Diagnosis Date Noted Date Resolved   Chest pain 08/21/2022 08/22/2022     Discharge Instructions     Diet general   Complete by: As directed    Increase activity slowly   Complete by: As directed       Allergies as of 08/22/2022   No Known Allergies      Medication List     STOP taking these medications    doxycycline 50 MG capsule Commonly known as: VIBRAMYCIN   traZODone 50 MG tablet Commonly known as: DESYREL       TAKE these medications    albuterol 108 (90 Base) MCG/ACT inhaler Commonly known as: VENTOLIN HFA Inhale 2 puffs into the lungs every 4 (four) hours as needed for wheezing or shortness of breath.   ALPRAZolam 0.25 MG tablet Commonly known as: XANAX Take 0.25 mg by mouth daily as needed.   aspirin EC 81 MG tablet Take 81 mg by mouth daily.   atorvastatin 40 MG tablet Commonly known as: LIPITOR Take 40 mg by mouth daily.   cefdinir 300 MG capsule Commonly known as: OMNICEF Take 300 mg by mouth 2 (two) times daily.   cetirizine 10 MG tablet Commonly known as: ZYRTEC Take 10 mg by mouth daily as needed.   co-enzyme Q-10 30 MG capsule Take 200 mg by mouth daily.   esomeprazole 40 MG capsule Commonly known as: NEXIUM Take 1 capsule (40 mg total) by mouth 2 (two) times daily before a meal. Pt takes BRAND  Take before breakfast and dinner. What changed: when to take this   metoprolol succinate 25  MG 24 hr tablet Commonly known as: TOPROL-XL Take 25 mg by mouth every evening.   multivitamin tablet Take 1 tablet by mouth daily.   NICOTINAMIDE PO Take 500 mg by mouth daily.   predniSONE 20 MG tablet Commonly known as: DELTASONE Take 2 tablets (40 mg total) by mouth daily with breakfast for 5 days.   Synthroid 100 MCG tablet Generic drug: levothyroxine Take 100 mcg by mouth daily before breakfast. Pt takes BRAND        No Known Allergies  Consultations: Cardiology   Procedures:   Discharge Exam: BP (!) 99/52 (BP  Location: Left Arm)   Pulse 63   Temp 98.5 F (36.9 C) (Oral)   Resp 18   Ht 5\' 5"  (1.651 m)   Wt 85.3 kg   SpO2 92%   BMI 31.28 kg/m  Physical Exam Constitutional:      Appearance: Normal appearance.  HENT:     Head: Normocephalic and atraumatic.     Mouth/Throat:     Mouth: Mucous membranes are moist.  Eyes:     Extraocular Movements: Extraocular movements intact.  Cardiovascular:     Rate and Rhythm: Normal rate and regular rhythm.  Pulmonary:     Effort: Pulmonary effort is normal. No respiratory distress.     Breath sounds: Examination of the left-upper field reveals rhonchi. Examination of the left-middle field reveals rhonchi. Examination of the left-lower field reveals rhonchi. Rhonchi present.  Abdominal:     General: Bowel sounds are normal. There is no distension.     Palpations: Abdomen is soft.     Tenderness: There is no abdominal tenderness.  Musculoskeletal:        General: Normal range of motion.     Cervical back: Normal range of motion and neck supple.  Skin:    General: Skin is warm and dry.  Neurological:     General: No focal deficit present.     Mental Status: She is alert and oriented to person, place, and time.  Psychiatric:        Mood and Affect: Mood normal.        Behavior: Behavior normal.      The results of significant diagnostics from this hospitalization (including imaging, microbiology, ancillary and laboratory) are listed below for reference.   Microbiology: Recent Results (from the past 240 hour(s))  SARS Coronavirus 2 by RT PCR (hospital order, performed in [facility] hospital lab) *cepheid single result test* Anterior Nasal Swab     Status: None   Collection Time: 08/22/22 10:58 AM   Specimen: Anterior Nasal Swab  Result Value Ref Range Status   SARS Coronavirus 2 by RT PCR NEGATIVE NEGATIVE Final    Comment: Performed at [facility], [address] [place], [place] [address]  Respiratory (~20 pathogens) panel by PCR      Status: Abnormal   Collection Time: 08/22/22  2:22 PM   Specimen: Nasopharyngeal Swab; Respiratory  Result Value Ref Range Status   Adenovirus NOT DETECTED NOT DETECTED Final   Coronavirus 229E NOT DETECTED NOT DETECTED Final    Comment: (NOTE) The Coronavirus on the Respiratory Panel, DOES NOT test for the novel  Coronavirus (2019 nCoV)    Coronavirus HKU1 NOT DETECTED NOT DETECTED Final   Coronavirus NL63 NOT DETECTED NOT DETECTED Final   Coronavirus OC43 NOT DETECTED NOT DETECTED Final   Metapneumovirus NOT DETECTED NOT DETECTED Final   Rhinovirus / Enterovirus DETECTED (A) NOT DETECTED Final   Influenza  A NOT DETECTED NOT DETECTED Final   Influenza B NOT DETECTED NOT DETECTED Final   Parainfluenza Virus 1 NOT DETECTED NOT DETECTED Final   Parainfluenza Virus 2 NOT DETECTED NOT DETECTED Final   Parainfluenza Virus 3 NOT DETECTED NOT DETECTED Final   Parainfluenza Virus 4 NOT DETECTED NOT DETECTED Final   Respiratory Syncytial Virus NOT DETECTED NOT DETECTED Final   Bordetella pertussis NOT DETECTED NOT DETECTED Final   Bordetella Parapertussis NOT DETECTED NOT DETECTED Final   Chlamydophila pneumoniae NOT DETECTED NOT DETECTED Final   Mycoplasma pneumoniae NOT DETECTED NOT DETECTED Final    Comment: Performed at [facility], [address] [place], [place] [address]     Labs: BNP (last 3 results) No results for input(s): "BNP" in the last 8760 hours. Basic Metabolic Panel: Recent Labs  Lab 08/21/22 1938 08/22/22 0636  NA 137 141  K 3.2* 3.7  CL 104 107  CO2 24 25  GLUCOSE 102* 106*  BUN 16 12  CREATININE 0.76 0.86  CALCIUM 8.9 8.7*  MG  --  2.1   Liver Function Tests: Recent Labs  Lab 08/22/22 0636  AST 23  ALT 20  ALKPHOS 58  BILITOT 0.6  PROT 5.9*  ALBUMIN 3.2*   No results for input(s): "LIPASE", "AMYLASE" in the last 168 hours. No results for input(s): "AMMONIA" in the last 168 hours. CBC: Recent Labs  Lab 08/21/22 1938 08/22/22 0636   WBC 7.0 7.1  HGB 12.6 11.7*  HCT 39.0 36.4  MCV 90.7 89.2  PLT 150 147*   Cardiac Enzymes: No results for input(s): "CKTOTAL", "CKMB", "CKMBINDEX", "TROPONINI" in the last 168 hours. BNP: Invalid input(s): "POCBNP" CBG: No results for input(s): "GLUCAP" in the last 168 hours. D-Dimer No results for input(s): "DDIMER" in the last 72 hours. Hgb A1c No results for input(s): "HGBA1C" in the last 72 hours. Lipid Profile No results for input(s): "CHOL", "HDL", "LDLCALC", "TRIG", "CHOLHDL", "LDLDIRECT" in the last 72 hours. Thyroid function studies Recent Labs    08/22/22 0636  TSH 5.757*   Anemia work up No results for input(s): "VITAMINB12", "FOLATE", "FERRITIN", "TIBC", "IRON", "RETICCTPCT" in the last 72 hours. Urinalysis No results found for: "COLORURINE", "APPEARANCEUR", "LABSPEC", "PHURINE", "GLUCOSEU", "HGBUR", "BILIRUBINUR", "KETONESUR", "PROTEINUR", "UROBILINOGEN", "NITRITE", "LEUKOCYTESUR" Sepsis Labs Recent Labs  Lab 08/21/22 1938 08/22/22 0636  WBC 7.0 7.1   Microbiology Recent Results (from the past 240 hour(s))  SARS Coronavirus 2 by RT PCR (hospital order, performed in [facility] hospital lab) *cepheid single result test* Anterior Nasal Swab     Status: None   Collection Time: 08/22/22 10:58 AM   Specimen: Anterior Nasal Swab  Result Value Ref Range Status   SARS Coronavirus 2 by RT PCR NEGATIVE NEGATIVE Final    Comment: Performed at [facility], [address] [place], [place] [address]  Respiratory (~20 pathogens) panel by PCR     Status: Abnormal   Collection Time: 08/22/22  2:22 PM   Specimen: Nasopharyngeal Swab; Respiratory  Result Value Ref Range Status   Adenovirus NOT DETECTED NOT DETECTED Final   Coronavirus 229E NOT DETECTED NOT DETECTED Final    Comment: (NOTE) The Coronavirus on the Respiratory Panel, DOES NOT test for the novel  Coronavirus (2019 nCoV)    Coronavirus HKU1 NOT DETECTED NOT DETECTED Final   Coronavirus NL63 NOT  DETECTED NOT DETECTED Final   Coronavirus OC43 NOT DETECTED NOT DETECTED Final   Metapneumovirus NOT DETECTED NOT DETECTED Final   Rhinovirus /  Enterovirus DETECTED (A) NOT DETECTED Final   Influenza A NOT DETECTED NOT DETECTED Final   Influenza B NOT DETECTED NOT DETECTED Final   Parainfluenza Virus 1 NOT DETECTED NOT DETECTED Final   Parainfluenza Virus 2 NOT DETECTED NOT DETECTED Final   Parainfluenza Virus 3 NOT DETECTED NOT DETECTED Final   Parainfluenza Virus 4 NOT DETECTED NOT DETECTED Final   Respiratory Syncytial Virus NOT DETECTED NOT DETECTED Final   Bordetella pertussis NOT DETECTED NOT DETECTED Final   Bordetella Parapertussis NOT DETECTED NOT DETECTED Final   Chlamydophila pneumoniae NOT DETECTED NOT DETECTED Final   Mycoplasma pneumoniae NOT DETECTED NOT DETECTED Final    Comment: Performed at [facility], [address], [place], [place] [address]    Procedures/Studies: ECHOCARDIOGRAM COMPLETE  Result Date: 08/22/2022    ECHOCARDIOGRAM REPORT   Patient Name:   RANEISHA MATHES Date of Exam: 08/22/2022 Medical Rec #:  [id number]           Height:       65.0 in Accession #:    [id number]          Weight:       188.0 lb Date of Birth:  11-16-1955           BSA:          1.927 m Patient Age:    66 years            BP:           97/49 mmHg Patient Gender: F                   HR:           60 bpm. Exam Location:  Inpatient Procedure: 2D Echo, Intracardiac Opacification Agent, Color Doppler and Cardiac            Doppler Indications:    SOB, CP  History:        Patient has no prior history of Echocardiogram examinations.                 Signs/Symptoms:Shortness of Breath and Chest Pain.  Sonographer:    Milbert Coulter Referring Phys: [id number] [facility]  Sonographer Comments: Suboptimal parasternal window and suboptimal apical window. H/o breast reduction surgery. IMPRESSIONS  1. Left ventricular ejection fraction, by estimation, is 60 to 65%. The left ventricle has normal  function. The left ventricle has no regional wall motion abnormalities. Left ventricular diastolic parameters were normal.  2. Right ventricular systolic function is normal. The right ventricular size is normal.  3. The mitral valve is grossly normal. Trivial mitral valve regurgitation.  4. AV is difficult to see well. Mean gradient through valve is 6 mm Hg No signifciant stenosis.. Aortic valve regurgitation is not visualized.  5. The inferior vena cava is normal in size with greater than 50% respiratory variability, suggesting right atrial pressure of 3 mmHg. FINDINGS  Left Ventricle: Left ventricular ejection fraction, by estimation, is 60 to 65%. The left ventricle has normal function. The left ventricle has no regional wall motion abnormalities. Definity contrast agent was given IV to delineate the left ventricular  endocardial borders. The left ventricular internal cavity size was normal in size. There is no left ventricular hypertrophy. Left ventricular diastolic parameters were normal. Right Ventricle: The right ventricular size is normal. Right vetricular wall thickness was not assessed. Right ventricular systolic function is normal. Left Atrium: Left atrial size was normal in size. Right Atrium: Right atrial  size was normal in size. Pericardium: There is no evidence of pericardial effusion. Mitral Valve: The mitral valve is grossly normal. Trivial mitral valve regurgitation. Tricuspid Valve: The tricuspid valve is normal in structure. Tricuspid valve regurgitation is trivial. Aortic Valve: AV is difficult to see well. Mean gradient through valve is 6 mm Hg No signifciant stenosis. Aortic valve regurgitation is not visualized. Aortic valve mean gradient measures 7.0 mmHg. Aortic valve peak gradient measures 12.7 mmHg. Aortic valve area, by VTI measures 1.89 cm. Pulmonic Valve: The pulmonic valve was not well visualized. Aorta: The aortic root is normal in size and structure. Venous: The inferior vena cava  is normal in size with greater than 50% respiratory variability, suggesting right atrial pressure of 3 mmHg. IAS/Shunts: The interatrial septum was not assessed.  LEFT VENTRICLE PLAX 2D LVIDd:         4.90 cm   Diastology LVIDs:         3.30 cm   LV e' medial:    10.40 cm/s LV PW:         0.90 cm   LV E/e' medial:  7.0 LV IVS:        0.80 cm   LV e' lateral:   11.00 cm/s LVOT diam:     2.00 cm   LV E/e' lateral: 6.6 LV SV:         72 LV SV Index:   37 LVOT Area:     3.14 cm  RIGHT VENTRICLE RV Basal diam:  3.30 cm RV Mid diam:    3.20 cm RV S prime:     12.10 cm/s TAPSE (M-mode): 1.8 cm LEFT ATRIUM             Index        RIGHT ATRIUM           Index LA diam:        3.00 cm 1.56 cm/m   RA Area:     22.00 cm LA Vol (A2C):   46.9 ml 24.34 ml/m  RA Volume:   74.10 ml  38.46 ml/m LA Vol (A4C):   61.8 ml 32.07 ml/m LA Biplane Vol: 57.0 ml 29.58 ml/m  AORTIC VALVE AV Area (Vmax):    2.01 cm AV Area (Vmean):   1.81 cm AV Area (VTI):     1.89 cm AV Vmax:           178.00 cm/s AV Vmean:          121.000 cm/s AV VTI:            0.379 m AV Peak Grad:      12.7 mmHg AV Mean Grad:      7.0 mmHg LVOT Vmax:         114.00 cm/s LVOT Vmean:        69.900 cm/s LVOT VTI:          0.228 m LVOT/AV VTI ratio: 0.60  AORTA Ao Root diam: 2.70 cm MITRAL VALVE MV Area (PHT): 3.91 cm    SHUNTS MV Decel Time: 194 msec    Systemic VTI:  0.23 m MV E velocity: 72.70 cm/s  Systemic Diam: 2.00 cm MV A velocity: 68.00 cm/s MV E/A ratio:  1.07 Dietrich Pates MD Electronically signed by Dietrich Pates MD Signature Date/Time: 08/22/2022/3:55:50 PM    Final    CT Angio Chest PE W and/or Wo Contrast  Result Date: 08/21/2022 CLINICAL DATA:  Central chest pain. EXAM: CT ANGIOGRAPHY CHEST WITH CONTRAST  TECHNIQUE: Multidetector CT imaging of the chest was performed using the standard protocol during bolus administration of intravenous contrast. Multiplanar CT image reconstructions and MIPs were obtained to evaluate the vascular anatomy. RADIATION DOSE  REDUCTION: This exam was performed according to the departmental dose-optimization program which includes automated exposure control, adjustment of the mA and/or kV according to patient size and/or use of iterative reconstruction technique. CONTRAST:  75mL OMNIPAQUE IOHEXOL 350 MG/ML SOLN COMPARISON:  None Available. FINDINGS: Cardiovascular: The thoracic aorta is normal in appearance. Satisfactory opacification of the pulmonary arteries to the segmental level. No evidence of pulmonary embolism. Normal heart size. No pericardial effusion. Mediastinum/Nodes: No enlarged mediastinal, hilar, or axillary lymph nodes. Thyroid gland, trachea, and esophagus demonstrate no significant findings. Lungs/Pleura: Mild linear atelectasis is seen within the right lower lobe. Mild right lower lobe peribronchial thickening is also noted. There is no evidence of a pleural effusion or pneumothorax. Upper Abdomen: No acute abnormality. Musculoskeletal: No chest wall abnormality. No acute or significant osseous findings. Review of the MIP images confirms the above findings. IMPRESSION: 1. No evidence of pulmonary embolism. 2. Mild right lower lobe atelectasis and peribronchial thickening which may represent mild bronchitis. Electronically Signed   By: Aram Candela M.D.   On: 08/21/2022 22:19   DG Chest 2 View  Result Date: 08/21/2022 CLINICAL DATA:  Chest pain EXAM: CHEST - 2 VIEW COMPARISON:  None Available. FINDINGS: No consolidation, pneumothorax or effusion. No edema. Normal cardiopericardial silhouette. Mild degenerative changes of the spine. Deformity of right-sided ribcage. Possibly related to remote trauma or other process IMPRESSION: No acute cardiopulmonary disease Electronically Signed   By: Karen Kays M.D.   On: 08/21/2022 19:14     Time coordinating discharge: Over 30 minutes    Lewie Chamber, MD  [facility] 08/23/2022, 3:56 PM

## 2022-08-23 NOTE — Hospital Course (Signed)
Jessica [name] is a 67 year old female with PMH anxiety, GERD, HLD, hypothyroidism who presented with chest pain and cough. She had been evaluated outpatient and found to have an abnormal EKG and was referred to the hospital for further workup. She also had recently been diagnosed with bronchitis and started on outpatient antibiotics. EKG was concerning for T wave inversions in anterior and nonspecific ST wave changes.  Troponins were negative.  Cardiology was also consulted on admission. She underwent further infectious workup and was found to be positive for rhinovirus.  Her symptoms were considered viral in nature causing her underlying noncardiac chest pain.  She was continued on prednisone course at discharge.

## 2023-02-11 ENCOUNTER — Other Ambulatory Visit: Payer: Self-pay | Admitting: Specialist

## 2023-02-11 DIAGNOSIS — Z1231 Encounter for screening mammogram for malignant neoplasm of breast: Secondary | ICD-10-CM

## 2023-03-18 ENCOUNTER — Ambulatory Visit
Admission: RE | Admit: 2023-03-18 | Discharge: 2023-03-18 | Disposition: A | Discharge status: Home / Self Care | Payer: Medicare Other | Source: Ambulatory Visit | Attending: Specialist | Admitting: Specialist

## 2023-03-18 DIAGNOSIS — Z1231 Encounter for screening mammogram for malignant neoplasm of breast: Secondary | ICD-10-CM

## 2023-08-26 IMAGING — MG MM BREAST BX W/ LOC DEV 1ST LESION IMAGE BX SPEC STEREO GUIDE*R*
8 series · 8 of 20 positions shown · non-contrast
Comparison: Previous exams.
COMPARISON: Previous exams.

Addendum:
CLINICAL DATA: Patient presents for stereotactic guided core biopsy
of the RIGHT breast.

EXAM:
RIGHT BREAST STEREOTACTIC CORE NEEDLE BIOPSY

[R (1 of 3)]
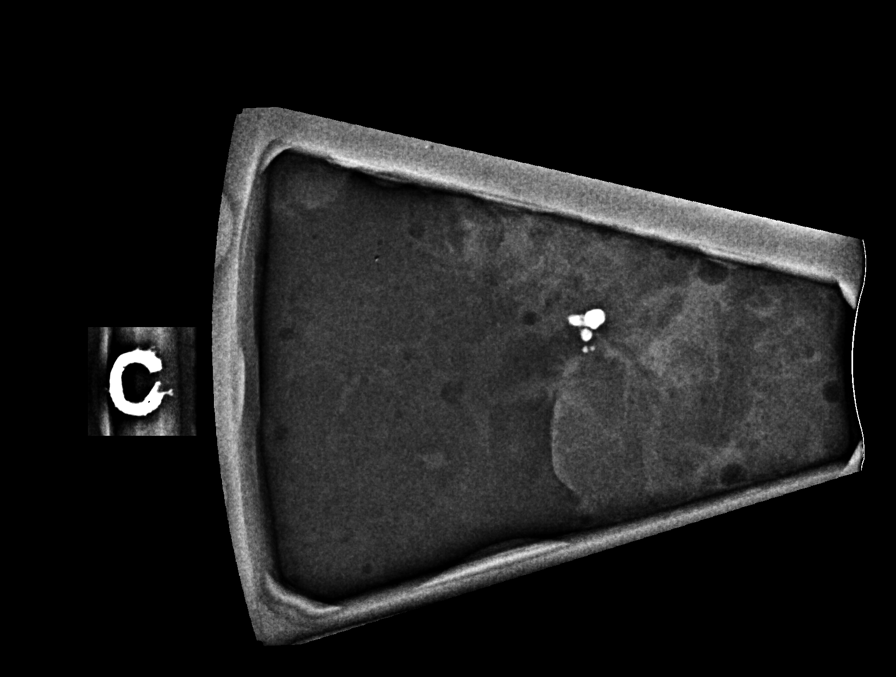

[R (2 of 3)]
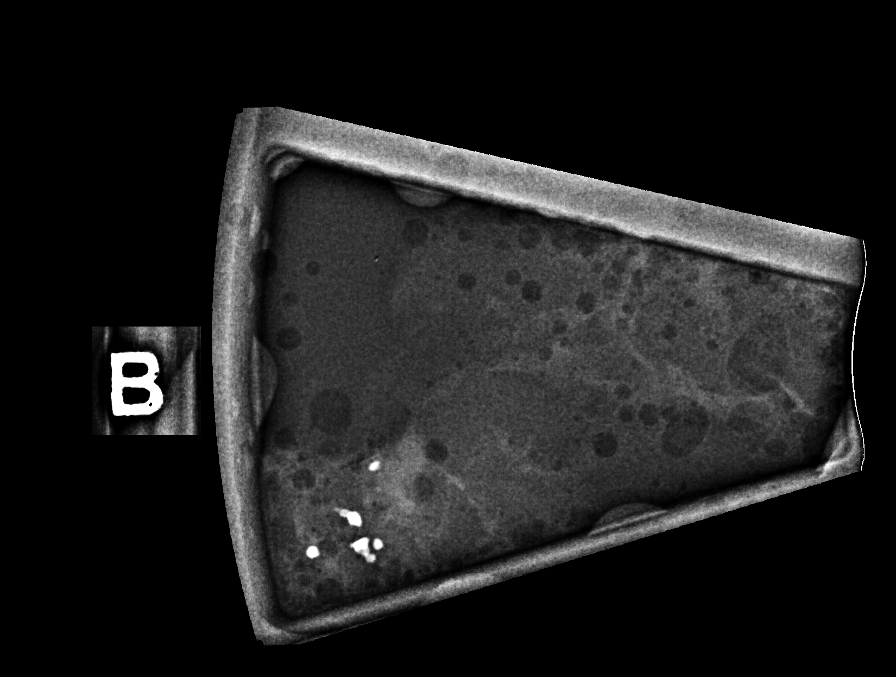

[R (3 of 3)]
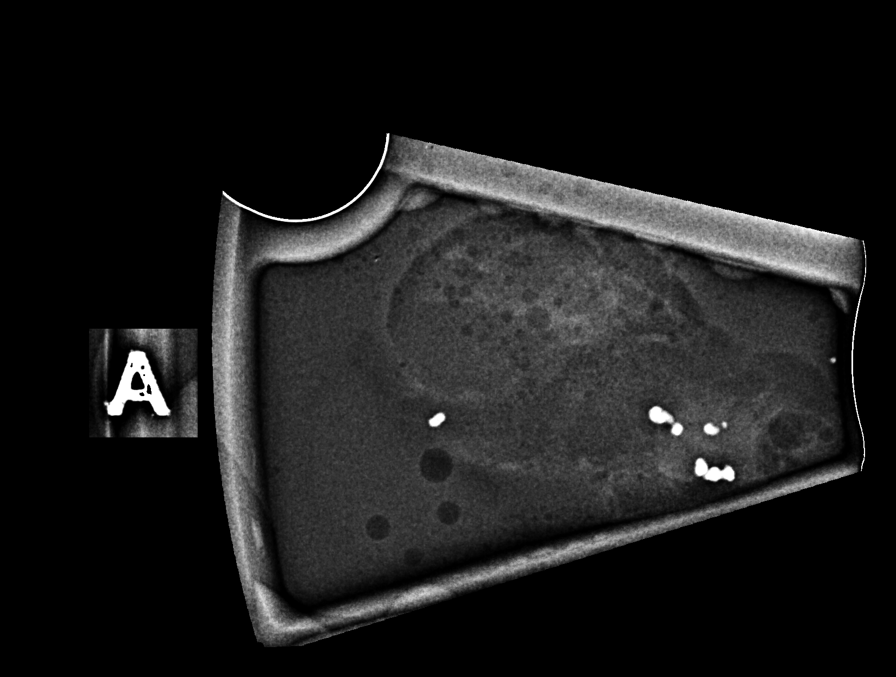

[R CC (1 of 2)]
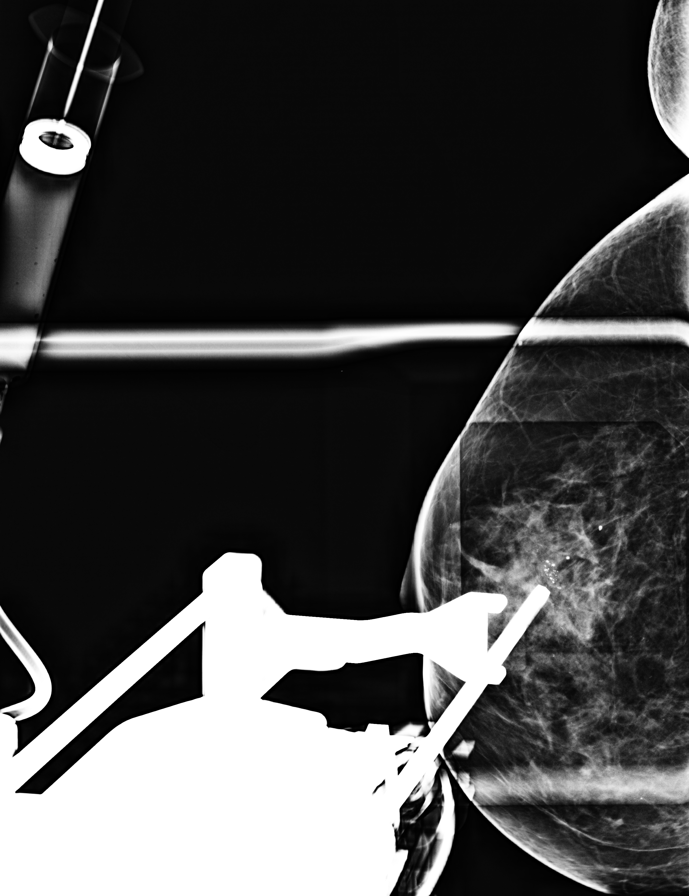

[R CC (2 of 2)]
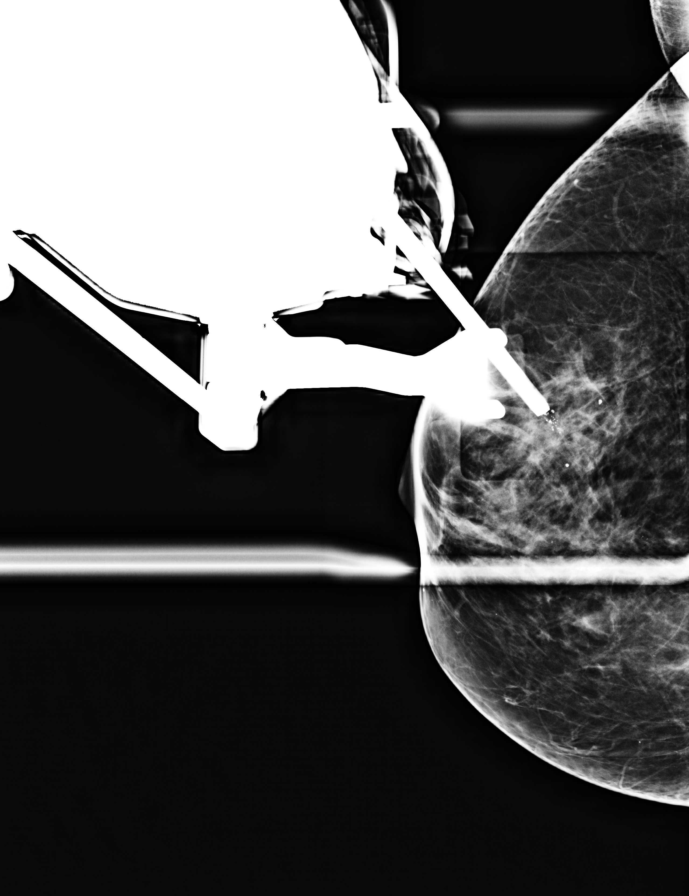

[R CC tomo (1 of 3) · tomo slice 29/56.0]
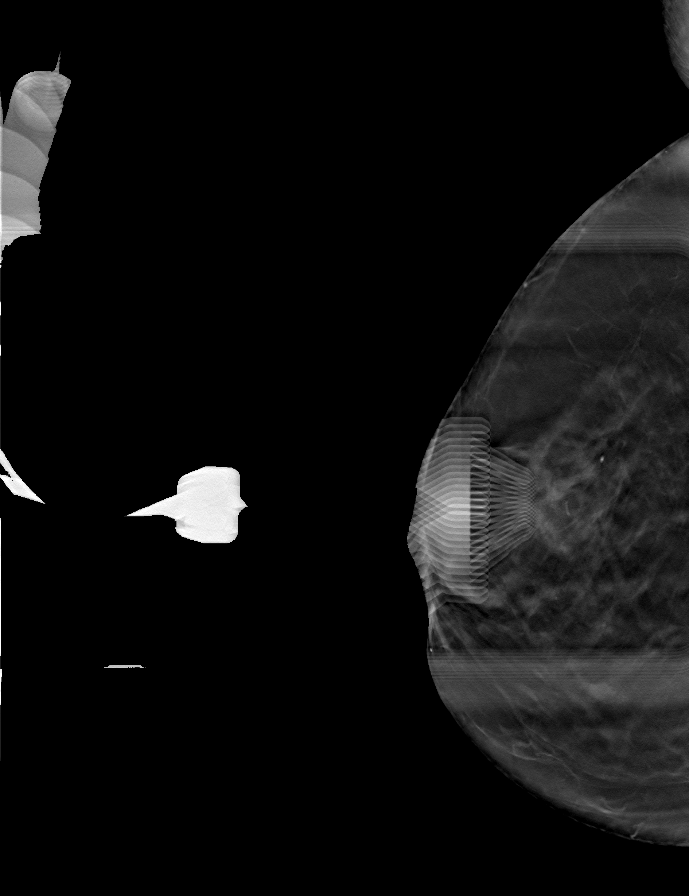

[R CC tomo (2 of 3) · tomo slice 29/56.0]
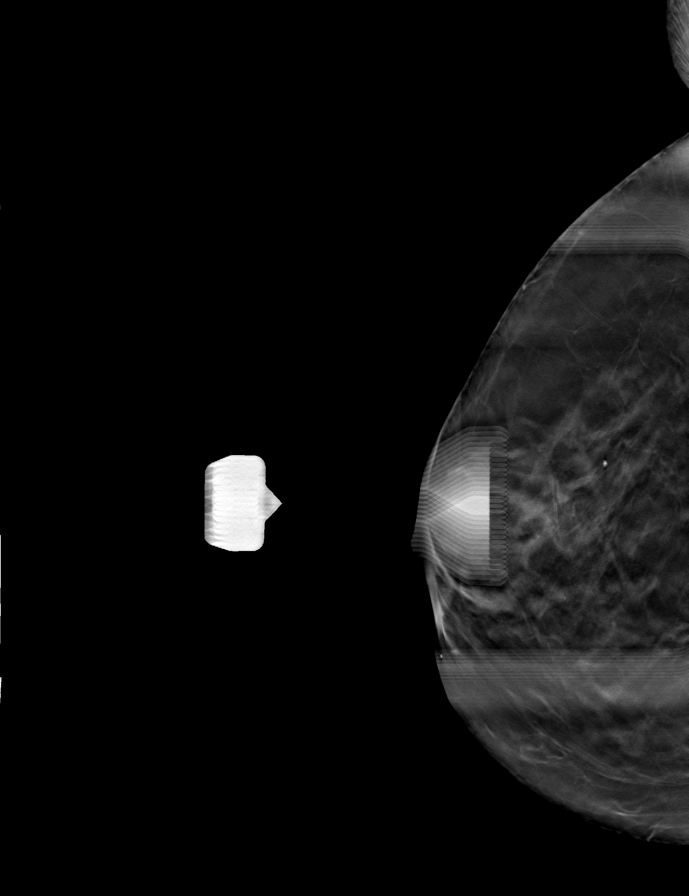

[R CC tomo (3 of 3) · tomo slice 29/56.0]
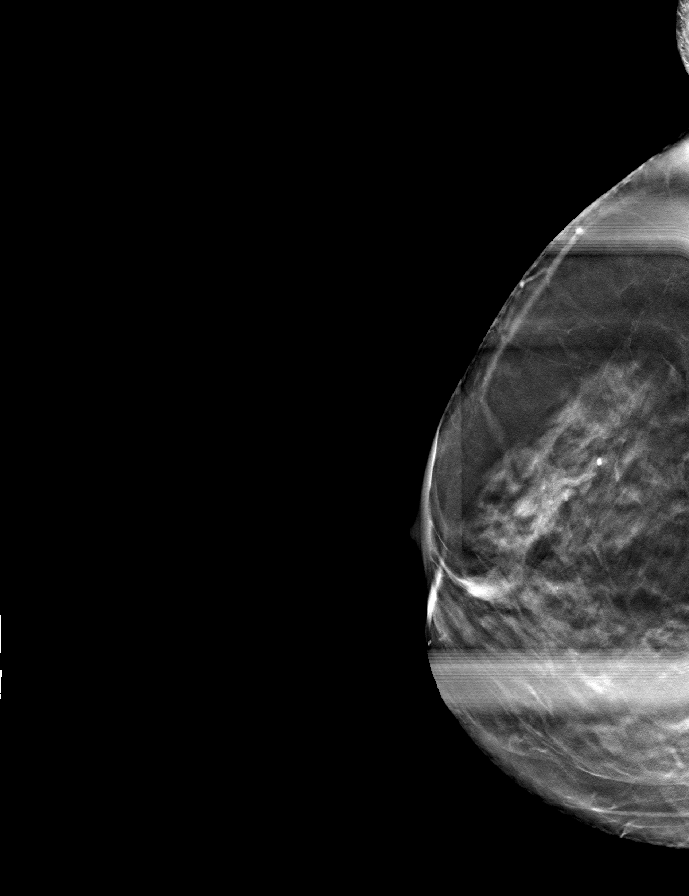

[8 of 20 positions shown; findings below may reference images not displayed]



Using sterile technique and 1% Lidocaine as local anesthetic, under
stereotactic guidance, a 9 gauge vacuum assisted device was used to
perform core needle biopsy of calcifications in the UPPER-OUTER
QUADRANT of the RIGHT breast using a craniocaudal approach. Specimen
radiograph was performed showing calcifications in numerous tissue
samples. Specimens with calcifications are identified for pathology.

Lesion quadrant: UPPER-OUTER QUADRANT RIGHT breast

At the conclusion of the procedure, X shaped shaped tissue marker
clip was deployed into the biopsy cavity. Follow-up 2-view mammogram
was performed and dictated separately.
IMPRESSION: Stereotactic-guided biopsy of RIGHT breast calcifications. No
apparent complications.

ADDENDUM:
PATHOLOGY ADDENDUM:

Pathology:

Breast, right, needle core biopsy, UOQ, X clip

- FAT NECROSIS WITH DYSTROPHIC CALCIFICATIONS

- NO MALIGNANCY IDENTIFIED

Pathology concordance with imaging findings: Yes

Recommendation: Screening mammogram is recommended in February 2022.

At the request of the patient, I spoke with her by telephone on
04/04/2021 at [DATE]. She reports doing well after the biopsy.



Using sterile technique and 1% Lidocaine as local anesthetic, under
stereotactic guidance, a 9 gauge vacuum assisted device was used to
perform core needle biopsy of calcifications in the UPPER-OUTER
QUADRANT of the RIGHT breast using a craniocaudal approach. Specimen
radiograph was performed showing calcifications in numerous tissue
samples. Specimens with calcifications are identified for pathology.

Lesion quadrant: UPPER-OUTER QUADRANT RIGHT breast

At the conclusion of the procedure, X shaped shaped tissue marker
clip was deployed into the biopsy cavity. Follow-up 2-view mammogram
was performed and dictated separately.
IMPRESSION: Stereotactic-guided biopsy of RIGHT breast calcifications. No
apparent complications.

## 2023-11-25 ENCOUNTER — Other Ambulatory Visit: Payer: Self-pay

## 2023-11-25 ENCOUNTER — Encounter (HOSPITAL_COMMUNITY): Payer: Self-pay | Admitting: Emergency Medicine

## 2023-11-25 ENCOUNTER — Emergency Department (HOSPITAL_COMMUNITY)

## 2023-11-25 ENCOUNTER — Emergency Department (HOSPITAL_COMMUNITY)
Admission: EM | Admit: 2023-11-25 | Discharge: 2023-11-26 | Disposition: A | Discharge status: Home / Self Care | Attending: Emergency Medicine | Admitting: Emergency Medicine

## 2023-11-25 DIAGNOSIS — K659 Peritonitis, unspecified: Secondary | ICD-10-CM | POA: Diagnosis not present

## 2023-11-25 DIAGNOSIS — Z7982 Long term (current) use of aspirin: Secondary | ICD-10-CM | POA: Diagnosis not present

## 2023-11-25 DIAGNOSIS — R109 Unspecified abdominal pain: Secondary | ICD-10-CM | POA: Diagnosis present

## 2023-11-25 DIAGNOSIS — K6389 Other specified diseases of intestine: Secondary | ICD-10-CM

## 2023-11-25 LAB — CBC
HCT: 38.3 % (ref 36.0–46.0)
Hemoglobin: 12.7 g/dL (ref 12.0–15.0)
MCH: 29.3 pg (ref 26.0–34.0)
MCHC: 33.2 g/dL (ref 30.0–36.0)
MCV: 88.2 fL (ref 80.0–100.0)
Platelets: 186 K/uL (ref 150–400)
RBC: 4.34 MIL/uL (ref 3.87–5.11)
RDW: 13.5 % (ref 11.5–15.5)
WBC: 7.5 K/uL (ref 4.0–10.5)
nRBC: 0 % (ref 0.0–0.2)

## 2023-11-25 LAB — COMPREHENSIVE METABOLIC PANEL WITH GFR
ALT: 14 U/L (ref 0–44)
AST: 19 U/L (ref 15–41)
Albumin: 3.6 g/dL (ref 3.5–5.0)
Alkaline Phosphatase: 60 U/L (ref 38–126)
Anion gap: 8 (ref 5–15)
BUN: 17 mg/dL (ref 8–23)
CO2: 23 mmol/L (ref 22–32)
Calcium: 9.1 mg/dL (ref 8.9–10.3)
Chloride: 107 mmol/L (ref 98–111)
Creatinine, Ser: 0.88 mg/dL (ref 0.44–1.00)
GFR, Estimated: >60 mL/min (ref >60)
Glucose, Bld: 116 mg/dL — ABNORMAL HIGH (ref 70–99)
Potassium: 3.9 mmol/L (ref 3.5–5.1)
Sodium: 138 mmol/L (ref 135–145)
Total Bilirubin: 0.7 mg/dL (ref 0.0–1.2)
Total Protein: 6.3 g/dL — ABNORMAL LOW (ref 6.5–8.1)

## 2023-11-25 LAB — LIPASE, BLOOD: Lipase: 32 U/L (ref 11–51)

## 2023-11-25 LAB — TROPONIN I (HIGH SENSITIVITY): Troponin I (High Sensitivity): 2 ng/L (ref <18)

## 2023-11-25 MED ORDER — ONDANSETRON HCL 4 MG/2ML IJ SOLN
4.0000 mg | Freq: Once | INTRAMUSCULAR | Status: AC
  Administered 2023-11-25: 4 mg via INTRAVENOUS
  Filled 2023-11-25: qty 2

## 2023-11-25 MED ORDER — KETOROLAC TROMETHAMINE 30 MG/ML IJ SOLN
30.0000 mg | Freq: Once | INTRAMUSCULAR | Status: AC
  Administered 2023-11-25: 30 mg via INTRAVENOUS
  Filled 2023-11-25: qty 1

## 2023-11-25 MED ORDER — IOHEXOL 300 MG/ML  SOLN
100.0000 mL | Freq: Once | INTRAMUSCULAR | Status: AC | PRN
  Administered 2023-11-25: 100 mL via INTRAVENOUS

## 2023-11-25 MED ORDER — SODIUM CHLORIDE 0.9 % IV SOLN: INTRAVENOUS | Status: DC

## 2023-11-25 MED ORDER — ONDANSETRON HCL 4 MG PO TABS: 4.0000 mg | ORAL_TABLET | Freq: Four times a day (QID) | ORAL | 0 refills | Status: AC

## 2023-11-25 MED ORDER — PROMETHAZINE HCL 25 MG PO TABS: 25.0000 mg | ORAL_TABLET | Freq: Four times a day (QID) | ORAL | 0 refills | Status: AC | PRN

## 2023-11-25 NOTE — Discharge Instructions (Signed)
 Your testing today shows that you have some inflammation around the left upper abdomen, this is something that is not related to your colon, it will likely cause some pain, some chills and can even cause a low-grade fever but your symptoms should gradually get better over the next few days.  I have prescribed a couple of different medications for you including Zofran  for nausea and Phenergan  for nausea, try the Zofran  first if it is not working you may try the Phenergan .  Drink lots of clear liquids and be patient, if you are developing severe or worsening symptoms return to the emergency department immediately  Zofran  is a medication which can help with nausea.  You may take 4 mg by mouth every 6 hours as needed if you are an adult, if your child under the age of 6 take half of a tablet or 2 mg every 6 hours as needed.  This should dissolve on your tongue within a short timeframe.  Wait about 30 minutes after taking it to help with drinking clear liquids.  Phenergan  (promethazine ) is a medicine that is used for nausea - it is a very strong medicine and can cause sleepiness or sedation - like all medicines, it may cause some side effects or allergic reactions so if this happens to you stop the medicine and seek a medical exam immediately.  This is a medicine that comes in oral form but also in suppository form for rectal use if you are too sick to take the medicine by mouth.  Please talk to your pharmacist before taking this medicine to make sure that it doesn't interact with other medicines that you may take.  Thank you for allowing us  to treat you in the emergency department today.  After reviewing your examination and potential testing that was done it appears that you are safe to go home.  I would like for you to follow-up with your doctor within the next several days, have them obtain your records and follow-up with them to review all potential tests and results from your visit.  If you should develop  severe or worsening symptoms return to the emergency department immediately

## 2023-11-25 NOTE — ED Notes (Signed)
 ED Provider at bedside.

## 2023-11-25 NOTE — ED Triage Notes (Signed)
 Pt has been at the beach since Sunday.  Last night began having n/v and chills.  Came home and all day today has had chills.  Has continued to have vomiting.  Pain under bilateral breasts.  No diarrhea.

## 2023-11-25 NOTE — ED Provider Notes (Signed)
 [place] EMERGENCY DEPARTMENT AT [facility] Provider Note   CSN: [id number] Arrival date & time: 11/25/23  2058     Patient presents with: Emesis   Jessica [name] is a 68 y.o. female.    Emesis  This patient is a 68 year old female, no prior abdominal surgical history presenting with a complaint of chills and nausea will several episodes of vomiting today.  Started while she was at the beach last night, has been eating heavy foods all week, has been having her nausea and chills all day long.  Denies other urinary symptoms, has chronic constipation for which she has to use suppositories, she has had bowel movements today when she does that.  She has abdominal discomfort in the left upper quadrant and on the left chest wall, denies chest pain or shortness of breath, denies coughing at all and has never had pancreatitis.  She does report having a couple of glasses of wine this week.    Prior to Admission medications   Medication Sig Start Date End Date Taking? Authorizing Provider  ondansetron  (ZOFRAN ) 4 MG tablet Take 1 tablet (4 mg total) by mouth every 6 (six) hours. 11/25/23  Yes Cleotilde Rogue, MD  promethazine  (PHENERGAN ) 25 MG tablet Take 1 tablet (25 mg total) by mouth every 6 (six) hours as needed for nausea or vomiting. 11/25/23  Yes Cleotilde Rogue, MD  albuterol  (VENTOLIN  HFA) 108 (90 Base) MCG/ACT inhaler Inhale 2 puffs into the lungs every 4 (four) hours as needed for wheezing or shortness of breath. 08/22/22   Patsy Lenis, MD  ALPRAZolam  (XANAX ) 0.25 MG tablet Take 0.25 mg by mouth daily as needed. 10/08/21   [provider]  aspirin  EC 81 MG tablet Take 81 mg by mouth daily.    [provider]  atorvastatin  (LIPITOR) 40 MG tablet Take 40 mg by mouth daily.    [provider]  cefdinir (OMNICEF) 300 MG capsule Take 300 mg by mouth 2 (two) times daily. 08/20/22   [provider]  cetirizine (ZYRTEC) 10 MG tablet Take 10 mg by mouth  daily as needed.     [provider]  co-enzyme Q-10 30 MG capsule Take 200 mg by mouth daily.    [provider]  esomeprazole  (NEXIUM ) 40 MG capsule Take 1 capsule (40 mg total) by mouth 2 (two) times daily before a meal. Pt takes BRAND  Take before breakfast and dinner. Patient taking differently: Take 40 mg by mouth daily. Pt takes BRAND  Take before breakfast and dinner. 09/30/18   Esterwood, Amy S, PA-C  levothyroxine  (SYNTHROID ) 100 MCG tablet Take 100 mcg by mouth daily before breakfast. Pt takes BRAND    [provider]  metoprolol  succinate (TOPROL -XL) 25 MG 24 hr tablet Take 25 mg by mouth every evening. 03/08/21   [provider]  Multiple Vitamin (MULTIVITAMIN) tablet Take 1 tablet by mouth daily.    [provider]  Niacinamide-Zn-Cu-Methfo-Se-Cr (NICOTINAMIDE PO) Take 500 mg by mouth daily.    [provider]    Allergies: Patient has no known allergies.    Review of Systems  Gastrointestinal:  Positive for vomiting.  All other systems reviewed and are negative.   Updated Vital Signs BP (!) 117/48   Pulse 87   Temp 99 F (37.2 C) (Oral)   Resp 14   SpO2 90%   Physical Exam Vitals and nursing note reviewed.  Constitutional:      General: She is not in acute  distress.    Appearance: She is well-developed.  HENT:     Head: Normocephalic and atraumatic.     Mouth/Throat:     Pharynx: No oropharyngeal exudate.  Eyes:     General: No scleral icterus.       Right eye: No discharge.        Left eye: No discharge.     Conjunctiva/sclera: Conjunctivae normal.     Pupils: Pupils are equal, round, and reactive to light.  Neck:     Thyroid : No thyromegaly.     Vascular: No JVD.  Cardiovascular:     Rate and Rhythm: Normal rate and regular rhythm.     Heart sounds: Normal heart sounds. No murmur heard.    No friction rub. No gallop.  Pulmonary:     Effort: Pulmonary effort is normal. No respiratory distress.      Breath sounds: Normal breath sounds. No wheezing or rales.  Abdominal:     General: Bowel sounds are normal. There is no distension.     Palpations: Abdomen is soft. There is no mass.     Tenderness: There is abdominal tenderness.     Comments: Mild epigastric and left upper quadrant tenderness  Musculoskeletal:        General: No tenderness. Normal range of motion.     Cervical back: Normal range of motion and neck supple.     Right lower leg: No edema.     Left lower leg: No edema.  Lymphadenopathy:     Cervical: No cervical adenopathy.  Skin:    General: Skin is warm and dry.     Findings: No erythema or rash.  Neurological:     Mental Status: She is alert.     Coordination: Coordination normal.  Psychiatric:        Behavior: Behavior normal.     (all labs ordered are listed, but only abnormal results are displayed) Labs Reviewed  COMPREHENSIVE METABOLIC PANEL WITH GFR - Abnormal; Notable for the following components:      Result Value   Glucose, Bld 116 (*)    Total Protein 6.3 (*)    All other components within normal limits  LIPASE, BLOOD  CBC  URINALYSIS, ROUTINE W REFLEX MICROSCOPIC  TROPONIN I (HIGH SENSITIVITY)    EKG: EKG Interpretation Date/Time:  Thursday November 25 2023 21:27:17 EDT Ventricular Rate:  90 PR Interval:  163 QRS Duration:  106 QT Interval:  332 QTC Calculation: 407 R Axis:   263  Text Interpretation: Sinus rhythm LAD, consider left anterior fascicular block Low voltage, precordial leads Abnormal R-wave progression, late transition Borderline T abnormalities, anterior leads Confirmed by Cleotilde Rogue ([id number]) on 11/25/2023 9:46:12 PM  Radiology: CT ABDOMEN PELVIS W CONTRAST Result Date: 11/25/2023 CLINICAL DATA:  None EXAM: CT ABDOMEN AND PELVIS WITH CONTRAST TECHNIQUE: Multidetector CT imaging of the abdomen and pelvis was performed using the standard protocol following bolus administration of intravenous contrast. RADIATION DOSE REDUCTION:  This exam was performed according to the departmental dose-optimization program which includes automated exposure control, adjustment of the mA and/or kV according to patient size and/or use of iterative reconstruction technique. CONTRAST:  OMNIPAQUE  IOHEXOL  300 MG/ML  SOLN COMPARISON:  None Available. FINDINGS: Lower chest: No acute abnormality. Hepatobiliary: No focal liver abnormality is seen. No gallstones, gallbladder wall thickening, or biliary dilatation. Pancreas: Unremarkable. No pancreatic ductal dilatation or surrounding inflammatory changes. Spleen: Normal in size without focal abnormality. Adrenals/Urinary Tract: Adrenal glands are unremarkable. Kidneys are normal, without  renal calculi, focal lesion, or hydronephrosis. Bladder is unremarkable. Stomach/Bowel: Stomach is within normal limits. Appendix appears normal. No evidence of bowel wall thickening or distension. There is a small amount of inflammatory change in the omental fat adjacent to the transverse colon in the left upper quadrant image 2/48, nonspecific. Vascular/Lymphatic: No significant vascular findings are present. No enlarged abdominal or pelvic lymph nodes. Reproductive: Uterus and bilateral adnexa are unremarkable. Other: There is a small fat containing umbilical hernia. There is no ascites. Musculoskeletal: No acute findings. IMPRESSION: 1. Small amount of inflammatory change in the omental fat adjacent to the transverse colon in the left upper quadrant, nonspecific. Findings may be related to omental infarct or epiploic appendagitis. 2. Small fat containing umbilical hernia. Electronically Signed   By: Greig Pique M.D.   On: 11/25/2023 23:24     Procedures   Medications Ordered in the ED  0.9 %  sodium chloride  infusion ( Intravenous New Bag/Given 11/25/23 2206)  ondansetron  (ZOFRAN ) injection 4 mg (4 mg Intravenous Given 11/25/23 2206)  iohexol  (OMNIPAQUE ) 300 MG/ML solution 100 mL (100 mLs Intravenous Contrast Given  11/25/23 2246)  ondansetron  (ZOFRAN ) injection 4 mg (4 mg Intravenous Given 11/25/23 2327)  ketorolac  (TORADOL ) 30 MG/ML injection 30 mg (30 mg Intravenous Given 11/25/23 2339)                                    Medical Decision Making Amount and/or Complexity of Data Reviewed Labs: ordered. Radiology: ordered.  Risk Prescription drug management.    This patient presents to the ED for concern of abdominal discomfort with nausea, this involves an extensive number of treatment options, and is a complaint that carries with it a high risk of complications and morbidity.  The differential diagnosis includes pancreatitis, cholecystitis, gastroenteritis, less likely to be cardiac cause, EKG was unremarkable and unchanged   Co morbidities / Chronic conditions that complicate the patient evaluation  Obesity   Additional history obtained:  Additional history obtained from EMR External records from outside source obtained and reviewed including medical record, the patient has been seen in the hospital for what amounted to some palpitations, thought to be atrial fibrillation in May 2024, echocardiogram at that time revealed normal ejection fraction and normal left ventricular and right ventricular systolic function.   Lab Tests:  I Ordered, and personally interpreted labs.  The pertinent results include: No significant leukocytosis or renal dysfunction, electrolytes are okay, lipase is normal, troponin is normal   Imaging Studies ordered:  I ordered imaging studies including CT scan of the abdomen and pelvis I independently visualized and interpreted imaging which showed inflammatory changes in the left upper quadrant likely related to epiploic appendagitis or omental inflammation I agree with the radiologist interpretation   Cardiac Monitoring: / EKG:  The patient was maintained on a cardiac monitor.  I personally viewed and interpreted the cardiac monitored which showed an underlying  rhythm of: Normal sinus rhythm   Problem List / ED Course / Critical interventions / Medication management  Patient was given Zofran , Toradol , IV fluids I ordered medication including antiemetics and pain medications Reevaluation of the patient after these medicines showed that the patient improved, patient felt better I have reviewed the patients home medicines and have made adjustments as needed    Social Determinants of Health:  None   Test / Admission - Considered:  Patient stable for discharge, she was informed of  all of her results prior to discharge, no signs of sepsis      Final diagnoses:  Epiploic appendagitis    ED Discharge Orders          Ordered    ondansetron  (ZOFRAN ) 4 MG tablet  Every 6 hours        11/25/23 2335    promethazine  (PHENERGAN ) 25 MG tablet  Every 6 hours PRN        11/25/23 2335               Cleotilde Rogue, MD 11/25/23 2348

## 2023-11-25 NOTE — ED Notes (Signed)
 Patient transported to CT

## 2023-11-26 DIAGNOSIS — K659 Peritonitis, unspecified: Secondary | ICD-10-CM | POA: Diagnosis not present

## 2023-11-26 LAB — URINALYSIS, ROUTINE W REFLEX MICROSCOPIC
Bilirubin Urine: NEGATIVE
Glucose, UA: NEGATIVE mg/dL
Hgb urine dipstick: NEGATIVE
Ketones, ur: NEGATIVE mg/dL
Leukocytes,Ua: NEGATIVE
Nitrite: NEGATIVE
Protein, ur: NEGATIVE mg/dL
Specific Gravity, Urine: >1.046 — ABNORMAL HIGH (ref 1.005–1.030)
pH: 7 (ref 5.0–8.0)

## 2023-11-26 MED ORDER — SODIUM CHLORIDE 0.9 % IV SOLN
12.5000 mg | Freq: Once | INTRAVENOUS | Status: AC
  Administered 2023-11-26: 12.5 mg via INTRAVENOUS
  Filled 2023-11-26: qty 0.5

## 2024-02-21 ENCOUNTER — Other Ambulatory Visit: Payer: Self-pay | Admitting: Specialist

## 2024-02-21 DIAGNOSIS — Z1231 Encounter for screening mammogram for malignant neoplasm of breast: Secondary | ICD-10-CM

## 2024-03-20 ENCOUNTER — Ambulatory Visit: Payer: PRIVATE HEALTH INSURANCE

## 2024-04-11 ENCOUNTER — Ambulatory Visit
Admission: RE | Admit: 2024-04-11 | Discharge: 2024-04-11 | Disposition: A | Discharge status: Home / Self Care | Payer: PRIVATE HEALTH INSURANCE | Source: Ambulatory Visit | Attending: Specialist | Admitting: Specialist

## 2024-04-11 DIAGNOSIS — Z1231 Encounter for screening mammogram for malignant neoplasm of breast: Secondary | ICD-10-CM
